# Patient Record
Sex: Female | Born: 1975 | Race: White | Hispanic: No | Marital: Married | Smoking: Never smoker
Health system: Southern US, Community
[De-identification: ages and names within clinical notes are randomized; demographics above are authoritative.]

## PROBLEM LIST (undated history)

## (undated) ENCOUNTER — Emergency Department (HOSPITAL_COMMUNITY): Admission: EM | Discharge status: Absent without leave | Payer: 59

## (undated) DIAGNOSIS — K219 Gastro-esophageal reflux disease without esophagitis: Secondary | ICD-10-CM

## (undated) DIAGNOSIS — N816 Rectocele: Secondary | ICD-10-CM

## (undated) DIAGNOSIS — N393 Stress incontinence (female) (male): Secondary | ICD-10-CM

## (undated) DIAGNOSIS — Z973 Presence of spectacles and contact lenses: Secondary | ICD-10-CM

## (undated) DIAGNOSIS — K589 Irritable bowel syndrome without diarrhea: Secondary | ICD-10-CM

## (undated) HISTORY — PX: KNEE ARTHROSCOPY: SUR90

---

## 1998-08-26 HISTORY — PX: FRACTURE SURGERY: SHX138

## 1999-08-06 HISTORY — PX: ORIF FEMUR FRACTURE: SHX2119

## 2002-07-10 ENCOUNTER — Encounter: Payer: Self-pay | Admitting: Emergency Medicine

## 2002-07-10 ENCOUNTER — Emergency Department (HOSPITAL_COMMUNITY): Admission: EM | Admit: 2002-07-10 | Discharge: 2002-07-10 | Payer: Self-pay | Admitting: Emergency Medicine

## 2003-06-19 ENCOUNTER — Ambulatory Visit (HOSPITAL_COMMUNITY): Admission: RE | Admit: 2003-06-19 | Discharge: 2003-06-19 | Payer: Self-pay | Admitting: Family Medicine

## 2003-07-17 ENCOUNTER — Ambulatory Visit (HOSPITAL_COMMUNITY): Admission: RE | Admit: 2003-07-17 | Discharge: 2003-07-17 | Payer: Self-pay | Admitting: *Deleted

## 2003-12-11 ENCOUNTER — Ambulatory Visit (HOSPITAL_COMMUNITY): Admission: RE | Admit: 2003-12-11 | Discharge: 2003-12-11 | Payer: Self-pay | Admitting: *Deleted

## 2004-04-13 ENCOUNTER — Ambulatory Visit (HOSPITAL_COMMUNITY): Admission: RE | Admit: 2004-04-13 | Discharge: 2004-04-13 | Payer: Self-pay | Admitting: Internal Medicine

## 2004-04-29 ENCOUNTER — Ambulatory Visit: Payer: Self-pay | Admitting: Orthopedic Surgery

## 2004-06-28 ENCOUNTER — Ambulatory Visit: Payer: Self-pay | Admitting: Orthopedic Surgery

## 2004-07-06 ENCOUNTER — Ambulatory Visit: Payer: Self-pay | Admitting: Orthopedic Surgery

## 2004-07-06 ENCOUNTER — Ambulatory Visit (HOSPITAL_COMMUNITY): Admission: RE | Admit: 2004-07-06 | Discharge: 2004-07-06 | Payer: Self-pay | Admitting: Orthopedic Surgery

## 2004-07-08 ENCOUNTER — Ambulatory Visit: Payer: Self-pay | Admitting: Orthopedic Surgery

## 2004-07-09 ENCOUNTER — Encounter (HOSPITAL_COMMUNITY): Admission: RE | Admit: 2004-07-09 | Discharge: 2004-08-08 | Payer: Self-pay | Admitting: Orthopedic Surgery

## 2004-07-28 ENCOUNTER — Ambulatory Visit: Payer: Self-pay | Admitting: Orthopedic Surgery

## 2004-08-11 ENCOUNTER — Ambulatory Visit: Payer: Self-pay | Admitting: Orthopedic Surgery

## 2004-10-21 ENCOUNTER — Ambulatory Visit: Payer: Self-pay | Admitting: Orthopedic Surgery

## 2005-04-29 ENCOUNTER — Ambulatory Visit: Payer: Self-pay | Admitting: Internal Medicine

## 2005-10-26 ENCOUNTER — Ambulatory Visit: Payer: Self-pay | Admitting: Internal Medicine

## 2007-10-01 ENCOUNTER — Encounter (INDEPENDENT_AMBULATORY_CARE_PROVIDER_SITE_OTHER): Payer: Self-pay | Admitting: Internal Medicine

## 2007-10-01 ENCOUNTER — Ambulatory Visit: Payer: Self-pay | Admitting: Internal Medicine

## 2007-10-01 ENCOUNTER — Encounter: Payer: Self-pay | Admitting: Internal Medicine

## 2007-10-01 DIAGNOSIS — E669 Obesity, unspecified: Secondary | ICD-10-CM | POA: Insufficient documentation

## 2007-10-01 DIAGNOSIS — R5381 Other malaise: Secondary | ICD-10-CM | POA: Insufficient documentation

## 2007-10-01 DIAGNOSIS — R5383 Other fatigue: Secondary | ICD-10-CM

## 2007-10-02 ENCOUNTER — Telehealth (INDEPENDENT_AMBULATORY_CARE_PROVIDER_SITE_OTHER): Payer: Self-pay | Admitting: Internal Medicine

## 2007-10-02 ENCOUNTER — Encounter (INDEPENDENT_AMBULATORY_CARE_PROVIDER_SITE_OTHER): Payer: Self-pay | Admitting: Internal Medicine

## 2007-10-02 LAB — CONVERTED CEMR LAB
AST: 10 units/L (ref 0–37)
Albumin: 4.2 g/dL (ref 3.5–5.2)
Alkaline Phosphatase: 55 units/L (ref 39–117)
BUN: 10 mg/dL (ref 6–23)
Basophils Relative: 0 % (ref 0–1)
Creatinine, Ser: 0.7 mg/dL (ref 0.40–1.20)
Eosinophils Absolute: 0.1 10*3/uL (ref 0.0–0.7)
Eosinophils Relative: 2 % (ref 0–5)
Free T4: 1.21 ng/dL (ref 0.89–1.80)
HCT: 42.3 % (ref 36.0–46.0)
MCHC: 32.4 g/dL (ref 30.0–36.0)
MCV: 89.6 fL (ref 78.0–100.0)
Monocytes Relative: 8 % (ref 3–12)
Neutrophils Relative %: 59 % (ref 43–77)
Potassium: 4.1 meq/L (ref 3.5–5.3)
RBC: 4.72 M/uL (ref 3.87–5.11)
Total Bilirubin: 0.3 mg/dL (ref 0.3–1.2)

## 2007-10-04 ENCOUNTER — Encounter (INDEPENDENT_AMBULATORY_CARE_PROVIDER_SITE_OTHER): Payer: Self-pay | Admitting: Internal Medicine

## 2007-11-23 ENCOUNTER — Ambulatory Visit: Payer: Self-pay | Admitting: Internal Medicine

## 2007-11-23 DIAGNOSIS — J029 Acute pharyngitis, unspecified: Secondary | ICD-10-CM | POA: Insufficient documentation

## 2007-11-23 LAB — CONVERTED CEMR LAB
Inflenza A Ag: NEGATIVE
Influenza B Ag: NEGATIVE
Rapid Strep: NEGATIVE

## 2007-11-24 ENCOUNTER — Encounter (INDEPENDENT_AMBULATORY_CARE_PROVIDER_SITE_OTHER): Payer: Self-pay | Admitting: Internal Medicine

## 2010-04-06 NOTE — Assessment & Plan Note (Signed)
Summary: acute pharyngitis   Vital Signs:  Patient Profile:   35 Years Old Female Height:     67.75 inches (172.72 cm) O2 Sat:      98 % O2 treatment:    Room Air Temp:     100.3 degrees F tympanic Pulse rate:   108 / minute Resp:     9 per minute BP sitting:   106 / 64  Vitals Entered By: Lutricia Horsfall, LPN (November 23, 2007 2:50 PM)                 Chief Complaint:  sore throat.  History of Present Illness: Last night noted the onset of sore throat and took nyquil but it didn't help.  She has some ear pain and a little bit of a cough.  She denies nasal congestion, post nasal drip, body aches, nausea, vomiting or diarrhea.  Has 68 month old daughter but she is not sick.      Current Allergies: No known allergies       Physical Exam  General:     Obese, in no acute distress. Alert and oriented X 3.  Ears:     Tympanic membranes clear.  Ear canals without erythema.  Nose:     mild Erythema and swelling with clear drainage.  Mouth:     + erythema and mild tonsilar enlargement without exudate. Neck:     no cervical lymphadenopathy.   Lungs:     Clear to auscultation bilaterally with good air movement, normal expansion.     Impression & Recommendations:  Problem # 1:  PHARYNGITIS, ACUTE (ICD-462) Suspect viral with negative strep swab but she meets 2/4 criteria for strep so will get strep culture.  Symptomatic treatment with tylenol and Duke's Magic Mouthwash. Orders: Flu A+B ([address]109) Rapid Strep (60454)   Complete Medication List: 1)  Ortho-novum 7/7/7 (28) 0.5/0.75/1-35 Mg-mcg Tabs (Norethin-eth estrad triphasic) .Marland Kitchen.. 1 by mouth once daily 2)  Zyrtec Allergy 10 Mg Tabs (Cetirizine hcl) .Marland Kitchen.. 1 by mouth once daily 3)  Duke's Magic Mouthwash  .... 5 ml gargle and spit every 4 to 6 hours as needed sore throat   Patient Instructions: 1)  The patient will be called with the results when they are available.   Prescriptions: DUKE'S MAGIC MOUTHWASH  5 ml gargle and spit every 4 to 6 hours as needed sore throat  #174ml x 0   Entered and Authorized by:   Erle Crocker MD   Signed by:   Erle Crocker MD on 11/23/2007   Method used:   Printed then faxed to ...       [facility]* (retail)       [address]       [place], [place]  [address]       Ph: [phone]       Fax: [phone]   RxID:   [id number]  ] Laboratory Results  Date/Time Received: November 23, 2007 2:55 PM   Other Tests  Rapid Strep: negative Influenza A: negative Influenza B: negative Comments: Strep Lot #[id number] exp 09-09-08 Flu Lot # [id number] exp 12-23-07  Kit Test Internal QC: Positive   (Normal Range: Negative)

## 2010-07-23 NOTE — H&P (Signed)
NAMEBELEN, Kendra Payne             ACCOUNT NO.:  [id number]   MEDICAL RECORD NO.:  [id number]          PATIENT TYPE:  AMB   LOCATION:  DAY                           FACILITY:  [facility]   PHYSICIAN:  Vickki Hearing, M.D.DATE OF BIRTH:  06-27-1975   DATE OF ADMISSION:  07/06/2004  DATE OF DISCHARGE:  LH                                HISTORY & PHYSICAL   CHIEF COMPLAINT:  Pain and locking left knee.   HISTORY OF PRESENT ILLNESS:  Ms. Kendra Payne is 35 years old, and presented  initially on April 29, 2004 with left knee pain and a history of locking  after twisting her knee.  At the time I saw her, most of her pain and  locking episode had resolved, and she was treated for patellofemoral pain  syndrome, and was put on a home exercise program.  She did well for 2  months, and then had a pseudolocking episode of the knee again in late April  of 2006.  Re-examination revealed medial joint line tenderness and a range  of motion of 10-80, suggesting mechanical problem in the knee, and she has  presented for arthroscopy of the left knee with presumed medial meniscal  tear.   REVIEW OF SYSTEMS:  Negative, except for musculoskeletal symptoms.   ALLERGIES:  She has no allergies.   PAST MEDICAL HISTORY:  No previous medical problems.  She has a left femoral  rod.   MEDICATIONS:  She takes an estrogen combination pill/BCP.   FAMILY HISTORY:  Heart disease, cancer, diabetes.   FAMILY PHYSICIAN:  Dr. Sherwood Gambler.   SOCIAL HISTORY:  She is married.  She is a Pension scheme manager.  She does not smoke or drink.  She does use caffeinated  beverages.  She has her high school diploma.   PHYSICAL EXAMINATION:  VITAL SIGNS:  Weight 210, pulse 70, respiratory rate  18.  Temperature normal.  GENERAL:  Normal development, grooming, hygiene, nutrition.  Body habitus  mesomorphic.  She is alert and oriented x3.  Sensation and neurologic  function normal.  CARDIOVASCULAR:  Normal pulse and  perfusion.  No venostasis.  EXTREMITIES:  No edema.  Left knee exam shows range of motion again at 10 to  80.  Muscle tone is good.  Knee is stable.  Tenderness over the medial  compartment.  Other extremities have normal range of motion, strength,  stability, and alignment.  SKIN:  Normal without rash, lesions, or ulcers.   RADIOGRAPHS:  No specific abnormalities on plain film.   IMPRESSION:  1.  Medial meniscal tear.  2.  Locked knee.   PLAN:  Arthroscopy, left knee.  Medial meniscectomy.  Removal of any  mechanical problems which are associated with her locking episodes.      SEH/MEDQ  D:  07/03/2004  T:  07/03/2004  Job:  [id number]

## 2010-07-23 NOTE — Procedures (Signed)
NAMEERRIN, WHITELAW             ACCOUNT NO.:  [id number]   MEDICAL RECORD NO.:  [id number]          PATIENT TYPE:  OUT   LOCATION:  RAD                           FACILITY:  [facility]   PHYSICIAN:  Dani Gobble, MD       DATE OF BIRTH:  07-01-1975   DATE OF PROCEDURE:  12/11/2003  DATE OF DISCHARGE:                                  ECHOCARDIOGRAM   INDICATION:  Ms. Lemire is a 35 year old female who is referred for  evaluation of left ventricular function.  The technical quality of the study  is adequate.   The aorta is within normal limits at 3.3 cm.   The left atrium is within normal limits at 3.4 cm.  No obvious clots or  masses were appreciated, and the patient appeared to be in sinus rhythm  during this procedure.   The intraventricular septum and posterior wall are within normal limits in  thickness at 1.0 cm for each.   The aortic valve appears structurally normal.  There is no aortic  insufficiency.  Doppler interrogation of the aortic valve is within normal  limits.   The mitral valve also appears structurally normal.  There is no mitral valve  prolapse.  There is trivial mitral regurgitation.  Doppler interrogation of  the mitral valve is within normal limits.   The pulmonic valve appears grossly structurally normal.   The tricuspid valve also appears grossly structurally normal with mild  tricuspid regurgitation noted.   The left ventricle is normal in size with the LVIDD measured at 5.6 cm, and  the LVISD measured at 3.6 cm.  Overall left ventricular systolic function is  normal, and no regional wall motion abnormalities are appreciated.  The  right atrium and right ventricle appear normal in size, and the right  ventricle appears to exhibit normal right ventricular systolic function.   IMPRESSION:  Essentially normal echocardiogram, except for mild tricuspid  regurgitation.     AB/MEDQ  D:  12/11/2003  T:  12/11/2003  Job:  [id number]   cc:   Phillips Odor(?)  Charlette Caffey(?), M.D.

## 2010-07-23 NOTE — Op Note (Signed)
Kendra Payne, Kendra Payne             ACCOUNT NO.:  [id number]   MEDICAL RECORD NO.:  [id number]          PATIENT TYPE:  AMB   LOCATION:  DAY                           FACILITY:  [facility]   PHYSICIAN:  Vickki Hearing, M.D.DATE OF BIRTH:  [birth year]   DATE OF PROCEDURE:  07/06/2004  DATE OF DISCHARGE:                                 OPERATIVE REPORT   PREOPERATIVE DIAGNOSIS:  Torn medial meniscus, left knee.   POSTOPERATIVE DIAGNOSES:  1.  Chondral flap tear, medial femoral condyle.  2.  Chondromalacia of the patella, grade 3.   PROCEDURES:  1.  Arthroscopy left knee.  2.  Microfracture, medial femoral condyle.  3.  Chondroplasty, median ridge of patella.   SURGEON:  Vickki Hearing, M.D., no assistants.   ANESTHETIC:  General by LMA.   OPERATIVE FINDINGS:  There was a large chondral flap tear of the medial  femoral condyle measuring 20 mm longitudinally x 15 mm medial to lateral.  There was significant and severe chondromalacia of the median ridge of the  patella, grade 3.   HISTORY:  This is a 35 year old female who presented with complaints of  locking of the left knee.  On initial presentation her symptoms had  resolved, and we decided to treat her expectantly.  She came in with another  locking episode and we scheduled her for surgery for presumed medial  meniscal tear.   The patient was identified as Kendra Payne in the preop holding area, and  a mark was placed over the left knee.  This was countersigned by the  surgeon, the history and physical was updated, she was started on  antibiotics and taken to the operating room for general anesthetic via LMA  technique.  After successful anesthesia, the left leg was placed in a well  leg holder. The right leg was padded.  After sterile prep and drape, a time-  out was taken and the procedure was confirmed as arthroscopy, left knee, on  [employer], antibiotics were in, equipment was present to do the   procedure.   A two-incision technique was performed.  A diagnostic arthroscopy was done.  A probe through the medial portal was used to palpate the intra-articular  structures. The findings are as stated.   Using a straight shaver and an ArthroCare wand, a chondroplasty was done on  the medial femoral condyle and the patella.  We then took a chondral pick  and performed microfracture of the medial femoral condyle.  The knee was  then irrigated and suctioned, injected with 30 mL of plain Sensorcaine 0.5%,  and then we covered the portals with Steri-Strips.  We placed a sterile  bandage and a Cryo/Cuff and extubated the patient, took her to the recovery  room in stable condition.  She will be nonweightbearing for the first four  weeks, immediate range of motion can start.      SEH/MEDQ  D:  07/06/2004  T:  07/06/2004  Job:  [id number]

## 2010-07-23 NOTE — Procedures (Signed)
NAME:  ABEL, RA                       ACCOUNT NO.:  [id number]   MEDICAL RECORD NO.:  [id number]                   PATIENT TYPE:  OUT   LOCATION:  RAD                                  FACILITY:  [facility]   PHYSICIAN:  Vida Roller, M.D.                DATE OF BIRTH:  03/19/1975   DATE OF PROCEDURE:  06/19/2003  DATE OF DISCHARGE:                                  ECHOCARDIOGRAM   TAPE NUMBER:  LB [id number], tape count [id number] through [id number].   INDICATION:  This is a 35 year old woman with dyspnea.  No previous  echocardiograms.   TECHNICAL QUALITY:  Adequate.   M-MODE TRACINGS:  1. The aorta is 34 mm.  2. The left atrium is 32 mm.  3. The septum is 7 mm.  4. Left ventricular posterior wall is 7 mm.  5. Left ventricular diastolic dimension is 54 mm.  6. Left ventricular systolic dimension is 41 mm.   2-D AND DOPPLER IMAGING:  1. The left ventricle is slightly dilated.  There is depressed LV function     with an estimated ejection fraction of 45 to 50%.  There is global     hypokinesis.  Diastolic function was not well assessed.  2. The right ventricle is normal in size with normal systolic function.  3. Both atria appear to be normal size.  There is no atrial septal defect.  4. The aortic, mitral and tricuspid valves all perform normally with no     evidence of stenosis or regurgitation.  5. The pulmonic valve was not well seen.  6. The pericardial structures appeared normal.  7. The inferior vena cava was not well seen.  8. Ascending aorta was not well seen.      ___________________________________________                                            Vida Roller, M.D.   JH/MEDQ  D:  06/19/2003  T:  06/19/2003  Job:  [id number]

## 2015-04-01 DIAGNOSIS — M79632 Pain in left forearm: Secondary | ICD-10-CM | POA: Diagnosis not present

## 2015-04-03 DIAGNOSIS — M79632 Pain in left forearm: Secondary | ICD-10-CM | POA: Diagnosis not present

## 2015-06-17 DIAGNOSIS — Z0000 Encounter for general adult medical examination without abnormal findings: Secondary | ICD-10-CM | POA: Diagnosis not present

## 2015-06-17 DIAGNOSIS — Z6836 Body mass index (BMI) 36.0-36.9, adult: Secondary | ICD-10-CM | POA: Diagnosis not present

## 2015-06-17 DIAGNOSIS — K219 Gastro-esophageal reflux disease without esophagitis: Secondary | ICD-10-CM | POA: Diagnosis not present

## 2015-06-17 DIAGNOSIS — M545 Low back pain: Secondary | ICD-10-CM | POA: Diagnosis not present

## 2015-07-14 DIAGNOSIS — Z1231 Encounter for screening mammogram for malignant neoplasm of breast: Secondary | ICD-10-CM | POA: Diagnosis not present

## 2015-07-25 DIAGNOSIS — Z79899 Other long term (current) drug therapy: Secondary | ICD-10-CM | POA: Diagnosis not present

## 2015-07-25 DIAGNOSIS — R635 Abnormal weight gain: Secondary | ICD-10-CM | POA: Diagnosis not present

## 2015-07-25 DIAGNOSIS — R5383 Other fatigue: Secondary | ICD-10-CM | POA: Diagnosis not present

## 2015-07-25 DIAGNOSIS — R0602 Shortness of breath: Secondary | ICD-10-CM | POA: Diagnosis not present

## 2015-07-27 MED FILL — PHENTERMINE 37.5 MG TABLET: 37.5 | 30 days supply | Qty: 30 | Fill #0

## 2015-08-08 DIAGNOSIS — Z79899 Other long term (current) drug therapy: Secondary | ICD-10-CM | POA: Diagnosis not present

## 2015-08-08 DIAGNOSIS — E559 Vitamin D deficiency, unspecified: Secondary | ICD-10-CM | POA: Diagnosis not present

## 2015-08-08 DIAGNOSIS — R635 Abnormal weight gain: Secondary | ICD-10-CM | POA: Diagnosis not present

## 2015-08-08 DIAGNOSIS — R5383 Other fatigue: Secondary | ICD-10-CM | POA: Diagnosis not present

## 2015-08-28 DIAGNOSIS — Z79899 Other long term (current) drug therapy: Secondary | ICD-10-CM | POA: Diagnosis not present

## 2015-08-28 DIAGNOSIS — R635 Abnormal weight gain: Secondary | ICD-10-CM | POA: Diagnosis not present

## 2015-09-25 DIAGNOSIS — Z79899 Other long term (current) drug therapy: Secondary | ICD-10-CM | POA: Diagnosis not present

## 2015-09-25 DIAGNOSIS — R635 Abnormal weight gain: Secondary | ICD-10-CM | POA: Diagnosis not present

## 2015-09-28 MED FILL — PHENTERMINE 37.5 MG TABLET: 37.5 | 30 days supply | Qty: 30 | Fill #0

## 2015-10-23 DIAGNOSIS — Z79899 Other long term (current) drug therapy: Secondary | ICD-10-CM | POA: Diagnosis not present

## 2015-10-23 DIAGNOSIS — R635 Abnormal weight gain: Secondary | ICD-10-CM | POA: Diagnosis not present

## 2015-10-27 DIAGNOSIS — N393 Stress incontinence (female) (male): Secondary | ICD-10-CM | POA: Diagnosis not present

## 2015-10-27 DIAGNOSIS — N814 Uterovaginal prolapse, unspecified: Secondary | ICD-10-CM | POA: Diagnosis not present

## 2015-10-29 MED FILL — PHENTERMINE 37.5 MG TABLET: 37.5 | 30 days supply | Qty: 30 | Fill #0

## 2015-11-05 ENCOUNTER — Telehealth (HOSPITAL_COMMUNITY): Payer: Self-pay

## 2015-11-19 ENCOUNTER — Encounter (HOSPITAL_COMMUNITY): Payer: Self-pay

## 2015-11-19 ENCOUNTER — Ambulatory Visit (HOSPITAL_COMMUNITY): Payer: Self-pay | Admitting: Physical Therapy

## 2015-11-23 ENCOUNTER — Ambulatory Visit (HOSPITAL_COMMUNITY): Payer: 59 | Attending: Obstetrics and Gynecology | Admitting: Physical Therapy

## 2015-11-23 DIAGNOSIS — N393 Stress incontinence (female) (male): Secondary | ICD-10-CM | POA: Insufficient documentation

## 2015-11-23 DIAGNOSIS — M6281 Muscle weakness (generalized): Secondary | ICD-10-CM | POA: Diagnosis not present

## 2015-11-23 NOTE — Therapy (Signed)
[place] [facility] [address] [place], [place], [address] Phone: [phone]   Fax:  [phone]  Physical Therapy Evaluation  Patient Details  Name: Kendra LacyDebbie G Niday MRN: [id number] Date of Birth: 1975-05-05 Referring Provider: Silas Floodionne Galloway  Encounter Date: 11/23/2015      PT End of Session - 11/23/15 1227    Visit Number 1   Number of Visits 4   Date for PT Re-Evaluation 12/23/15   Authorization - Visit Number 1   Authorization - Number of Visits 4   PT Start Time 0910   PT Stop Time 0945   PT Time Calculation (min) 35 min   Activity Tolerance Patient tolerated treatment well   Behavior During Therapy [facility] for tasks assessed/performed      No past medical history on file.  No past surgical history on file.  There were no vitals filed for this visit.       Subjective Assessment - 11/23/15 0916    Subjective Kendra Payne states that she went to the MD because when she was washing she felt something hard in her vaginal area.  She felt it was her uterus which her MD confirmed.  She states that she has difficulty holding her urine when she sneezes , run or coughs.     Pertinent History 3 pregnancys    Currently in Pain? No/denies  but has occasional cramping pain 2x a week 7/10             [facility] PT Assessment - 11/23/15 0001      Assessment   Medical Diagnosis stress incontinence    Referring Provider Silas Floodionne Galloway   Onset Date/Surgical Date 10/06/13   Next MD Visit not scheduled   Prior Therapy none     Precautions   Precautions None     Restrictions   Weight Bearing Restrictions No     Balance Screen   Has the patient fallen in the past 6 months No     ROM / Strength   AROM / PROM / Strength --  upper abdominal 3/5; Lower 3/5;  kendall testing                  Pelvic Floor Special Questions - 11/23/15 0001    Prior Pelvic/Prostate Exam No   Are you Pregnant or attempting pregnancy? No    Prior Pregnancies Yes   Number of Pregnancies 3   Number of Vaginal Deliveries 3   Any difficulty with labor and deliveries No   Episiotomy Performed Yes   Currently Sexually Active Yes   Urinary Leakage Yes   How often depends if she sneezes or coughs but is getting worse.    Pad use no    Activities that cause leaking Coughing;Sneezing;Laughing;Running   Falling out feeling (prolapse) Yes   Activities that cause feeling of prolapse --  no activities just can feel the tip of her uterus.    Incontinence Goals Patient will be independent with completion of home exercise program for pelvic floor exercise, progressing to standing for all ADL's/activities in 4 weeks.;Patient will be independent with self-management techniques including: pelvic floor exercise, urge control techniques, behavioral modifications (reducing caffeine intake) and bladder retraining in 12 weeks;Patient will not have leaking with stress induced activities such as coughing, sneezing, laughing, etc in 12 weeks.          [facility] Treatment/Exercise - 11/23/15 0001      Exercises   Exercises Lumbar  Lumbar Exercises: Supine   Ab Set 5 reps   Other Supine Lumbar Exercises Kegal slow twitch hold 10 seconds relax 20 x 5                PT Education - 11/23/15 0951    Education provided Yes   Education Details Pt given Kegal sheet explained how to perform; and lower abdominal strengthening (isometric)    Person(s) Educated Patient   Methods Explanation;Demonstration;Handout   Comprehension Verbalized understanding;Returned demonstration          PT Short Term Goals - 11/23/15 1230      PT SHORT TERM GOAL #1   Title Pt to be completing pelvic floor and lower abdominal exercises as prescibe to reach long term goal of not leaking urine.    Time 1   Period Weeks   Status New     PT SHORT TERM GOAL #2   Title Pelvic pain to occur only once a week and level to be no greater than a 5/10 to improve  overall comfort.    Time 2   Period Weeks   Status New           PT Long Term Goals - 11/23/15 1233      PT LONG TERM GOAL #1   Title Pt to state that she has not had an episode of incontinence in the past week    Time 4   Period Weeks   Status New     PT LONG TERM GOAL #2   Title Pt to state that she is only having pelvic pain approximately once every other week and the greatest is a 3/10 to promote overall comfort.    Time 4   Period Weeks   Status New               Plan - 11/23/15 1227    Clinical Impression Statement Ms. Brenner is a 40 yo female who has had three children.  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  She is being referred to skilled physical therapy to assist in strengthening both her pelvic floor and her lower abdominal musculature.     Rehab Potential Fair   PT Frequency 1x / week   PT Duration 4 weeks   PT Treatment/Interventions ADLs/Self Care Home Management;Patient/family education;Therapeutic activities;Therapeutic exercise   PT Next Visit Plan begin heelslides, crunches, SLR progress lower abdominal strengthening as able.    PT Home Exercise Plan kegal, abdominal isometric    Consulted and Agree with Plan of Care Patient      Patient will benefit from skilled therapeutic intervention in order to improve the following deficits and impairments:  Decreased activity tolerance, Other (comment), Pain  Visit Diagnosis: Stress incontinence (female) (female) - Plan: PT plan of care cert/re-cert  Muscle weakness (generalized) - Plan: PT plan of care cert/re-cert     Problem List Patient Active Problem List   Diagnosis Date Noted  . PHARYNGITIS, ACUTE 11/23/2007  . OBESITY 10/01/2007  . FATIGUE 10/01/2007    Virgina Organ, PT CLT [phone] 11/23/2015, 12:42 PM  [facility] [address]  [place], [place], [address] Phone: [phone]   Fax:  [phone]  Name: Kendra Payne MRN: [id number] Date of Birth: Jan 25, 1976

## 2015-12-03 ENCOUNTER — Ambulatory Visit (HOSPITAL_COMMUNITY): Payer: 59 | Admitting: Physical Therapy

## 2015-12-07 ENCOUNTER — Telehealth (HOSPITAL_COMMUNITY): Payer: Self-pay

## 2015-12-07 ENCOUNTER — Ambulatory Visit (HOSPITAL_COMMUNITY): Payer: 59 | Admitting: Physical Therapy

## 2015-12-07 NOTE — Telephone Encounter (Signed)
12/07/15 patient came in and said to just cancel all remaining appointments.  She said that she was going to talk to her dr before resuming therapy

## 2015-12-14 ENCOUNTER — Encounter (HOSPITAL_COMMUNITY): Payer: 59 | Admitting: Physical Therapy

## 2015-12-21 ENCOUNTER — Encounter (HOSPITAL_COMMUNITY): Payer: 59 | Admitting: Physical Therapy

## 2016-01-25 DIAGNOSIS — Z6834 Body mass index (BMI) 34.0-34.9, adult: Secondary | ICD-10-CM | POA: Diagnosis not present

## 2016-01-25 DIAGNOSIS — Z1151 Encounter for screening for human papillomavirus (HPV): Secondary | ICD-10-CM | POA: Diagnosis not present

## 2016-01-25 DIAGNOSIS — Z01419 Encounter for gynecological examination (general) (routine) without abnormal findings: Secondary | ICD-10-CM | POA: Diagnosis not present

## 2016-01-25 DIAGNOSIS — Z3041 Encounter for surveillance of contraceptive pills: Secondary | ICD-10-CM | POA: Diagnosis not present

## 2016-01-25 DIAGNOSIS — Z803 Family history of malignant neoplasm of breast: Secondary | ICD-10-CM | POA: Diagnosis not present

## 2016-01-25 DIAGNOSIS — Z315 Encounter for genetic counseling: Secondary | ICD-10-CM | POA: Diagnosis not present

## 2016-02-24 DIAGNOSIS — Z315 Encounter for genetic counseling: Secondary | ICD-10-CM | POA: Diagnosis not present

## 2016-02-24 DIAGNOSIS — Z6834 Body mass index (BMI) 34.0-34.9, adult: Secondary | ICD-10-CM | POA: Diagnosis not present

## 2016-02-24 DIAGNOSIS — Z1371 Encounter for nonprocreative screening for genetic disease carrier status: Secondary | ICD-10-CM | POA: Diagnosis not present

## 2016-09-16 DIAGNOSIS — M79662 Pain in left lower leg: Secondary | ICD-10-CM | POA: Diagnosis not present

## 2016-09-16 DIAGNOSIS — M7989 Other specified soft tissue disorders: Secondary | ICD-10-CM | POA: Diagnosis not present

## 2016-09-20 DIAGNOSIS — R600 Localized edema: Secondary | ICD-10-CM | POA: Diagnosis not present

## 2016-09-20 DIAGNOSIS — M7989 Other specified soft tissue disorders: Secondary | ICD-10-CM | POA: Diagnosis not present

## 2016-09-20 DIAGNOSIS — M79662 Pain in left lower leg: Secondary | ICD-10-CM | POA: Diagnosis not present

## 2016-10-22 DIAGNOSIS — R21 Rash and other nonspecific skin eruption: Secondary | ICD-10-CM | POA: Diagnosis not present

## 2016-11-17 DIAGNOSIS — Z1231 Encounter for screening mammogram for malignant neoplasm of breast: Secondary | ICD-10-CM | POA: Diagnosis not present

## 2017-01-12 ENCOUNTER — Ambulatory Visit (INDEPENDENT_AMBULATORY_CARE_PROVIDER_SITE_OTHER): Payer: 59 | Admitting: Pediatrics

## 2017-01-12 ENCOUNTER — Encounter: Payer: Self-pay | Admitting: Pediatrics

## 2017-01-12 VITALS — BP 116/81 | HR 65 | Temp 97.5°F | Ht 67.75 in | Wt 236.0 lb

## 2017-01-12 DIAGNOSIS — Z0000 Encounter for general adult medical examination without abnormal findings: Secondary | ICD-10-CM | POA: Diagnosis not present

## 2017-01-12 DIAGNOSIS — Z6836 Body mass index (BMI) 36.0-36.9, adult: Secondary | ICD-10-CM

## 2017-01-12 DIAGNOSIS — I8392 Asymptomatic varicose veins of left lower extremity: Secondary | ICD-10-CM

## 2017-01-12 DIAGNOSIS — Z803 Family history of malignant neoplasm of breast: Secondary | ICD-10-CM | POA: Insufficient documentation

## 2017-01-12 DIAGNOSIS — R0683 Snoring: Secondary | ICD-10-CM

## 2017-01-12 DIAGNOSIS — E669 Obesity, unspecified: Secondary | ICD-10-CM | POA: Diagnosis not present

## 2017-01-12 NOTE — Progress Notes (Signed)
Subjective:   Patient ID: Kendra Payne, female    DOB: [birth year], 41 y.o.   MRN: [id number] CC: New Patient (Initial Visit) annual visit HPI: Kendra Payne is a 41 y.o. female presenting for New Patient (Initial Visit)  Married, three kids, 9yo, 6yo, 5yo Not walking regularly now, was more regularly in the past, apprx 3-4 times a month  Pap smear: due this month Gets mammograms regularly Follows with [facility] in [place] with breast cancer gene testing last year due to multiple family members with breast cancer, MGM, maternal aunts, testing was negative 36% lifetime risk she says  Feeling tired all the time Wakes up with a headache several days a week Wakes herself up choking/gasping for air a few times a month Has been sleeping propped up on pillows with some improvement in symptoms Husband says she snores loudly  L leg aching off and on Has been told she has varicose veins, venous insufficiency by prior PCP Recently had [place] of leg, no clot Has a rod in L femur Aching bothers her at night Pain and swelling improved when she wears compression hose  PMH: obesity, PCOS  Family History  Problem Relation Age of Onset  . Cancer Maternal Grandmother   . Diabetes Maternal Grandmother   . Cancer Maternal Grandfather   . Diabetes Maternal Grandfather   MGGM with colon cancer  Social History   Socioeconomic History  . Marital status: Married    Spouse name: [name]   . Number of children: 3  . Years of education: None  . Highest education level: Associate degree: academic program  Social Needs  . Financial resource strain: Not hard at all  . Food insecurity - worry: Never true  . Food insecurity - inability: Never true  . Transportation needs - medical: No  . Transportation needs - non-medical: No  Occupational History  . Occupation: Investment banker, operational: [place]  Tobacco Use  . Smoking status: Never  Smoker  . Smokeless tobacco: Never Used  Substance and Sexual Activity  . Alcohol use: No    Frequency: Never  . Drug use: No  . Sexual activity: Yes    Birth control/protection: Pill  Other Topics Concern  . None  Social History Narrative  . None   ROS: All systems negative other than what is in HPI  Objective:    BP 116/81   Pulse 65   Temp (!) 97.5 F (36.4 C) (Oral)   Ht 5' 7.75" (1.721 m)   Wt 236 lb (107 kg)   BMI 36.15 kg/m   Wt Readings from Last 3 Encounters:  01/12/17 236 lb (107 kg)  10/26/05 (!) 235 lb (106.6 kg)    Gen: NAD, alert, cooperative with exam, NCAT EYES: EOMI, no conjunctival injection, or no icterus ENT:  TMs slightly dull b/l with clear effusion, OP with mild erythema LYMPH: no cervical LAD CV: NRRR, normal S1/S2, no murmur, distal pulses 2+ b/l Resp: CTABL, no wheezes, normal WOB Abd: +BS, soft, NTND. no guarding or organomegaly Ext: No pitting edema, warm Neuro: Alert and oriented, strength equal b/l UE and LE, coordination grossly normal MSK: normal muscle bulk Skin: L medial knee and lower leg with superficial vessels, small varicose veins  Assessment & Plan:  Ercie was seen today for new patient (initial visit), annual  Diagnoses and all orders for this visit:  Encounter for preventive health examination  Snoring -     Ambulatory referral to Neurology  Varicose veins of left lower extremity, unspecified whether complicated Wear compression hose  Class 2 obesity without serious comorbidity with body mass index (BMI) of 36.0 to 36.9 in adult, unspecified obesity type Discussed lifestyle changes, stop sodas, increase physical activity -     CBC with Differential/Platelet -     CMP14+EGFR -     Lipid panel -     TSH  Family history of breast cancer Cont regular mammograms  Follow up plan: Return in about 1 year (around 01/12/2018). Assunta Found, MD [place]

## 2017-01-13 LAB — LIPID PANEL
CHOL/HDL RATIO: 3.8 ratio (ref 0.0–4.4)
Cholesterol, Total: 174 mg/dL (ref 100–199)
HDL: 46 mg/dL (ref >39)
LDL CALC: 103 mg/dL — AB (ref 0–99)
Triglycerides: 125 mg/dL (ref 0–149)
VLDL Cholesterol Cal: 25 mg/dL (ref 5–40)

## 2017-01-13 LAB — CBC WITH DIFFERENTIAL/PLATELET
BASOS: 0 %
Basophils Absolute: 0 10*3/uL (ref 0.0–0.2)
EOS (ABSOLUTE): 0.1 10*3/uL (ref 0.0–0.4)
Eos: 1 %
HEMATOCRIT: 38.7 % (ref 34.0–46.6)
HEMOGLOBIN: 12.7 g/dL (ref 11.1–15.9)
Immature Grans (Abs): 0 10*3/uL (ref 0.0–0.1)
Immature Granulocytes: 0 %
LYMPHS ABS: 3.6 10*3/uL — AB (ref 0.7–3.1)
Lymphs: 36 %
MCH: 29.8 pg (ref 26.6–33.0)
MCHC: 32.8 g/dL (ref 31.5–35.7)
MCV: 91 fL (ref 79–97)
MONOCYTES: 5 %
Monocytes Absolute: 0.5 10*3/uL (ref 0.1–0.9)
Neutrophils Absolute: 5.6 10*3/uL (ref 1.4–7.0)
Neutrophils: 58 %
Platelets: 385 10*3/uL — ABNORMAL HIGH (ref 150–379)
RBC: 4.26 x10E6/uL (ref 3.77–5.28)
RDW: 13.5 % (ref 12.3–15.4)
WBC: 9.9 10*3/uL (ref 3.4–10.8)

## 2017-01-13 LAB — CMP14+EGFR
ALT: 11 IU/L (ref 0–32)
AST: 15 IU/L (ref 0–40)
Albumin/Globulin Ratio: 1.4 (ref 1.2–2.2)
Albumin: 4.2 g/dL (ref 3.5–5.5)
Alkaline Phosphatase: 69 IU/L (ref 39–117)
BUN / CREAT RATIO: 12 (ref 9–23)
BUN: 10 mg/dL (ref 6–20)
Bilirubin Total: <0.2 mg/dL (ref 0.0–1.2)
CALCIUM: 9.5 mg/dL (ref 8.7–10.2)
CO2: 24 mmol/L (ref 20–29)
CREATININE: 0.82 mg/dL (ref 0.57–1.00)
Chloride: 99 mmol/L (ref 96–106)
GFR calc Af Amer: 104 mL/min/{1.73_m2} (ref >59)
GFR, EST NON AFRICAN AMERICAN: 90 mL/min/{1.73_m2} (ref >59)
GLOBULIN, TOTAL: 3 g/dL (ref 1.5–4.5)
Glucose: 83 mg/dL (ref 65–99)
Potassium: 4.7 mmol/L (ref 3.5–5.2)
SODIUM: 137 mmol/L (ref 134–144)
Total Protein: 7.2 g/dL (ref 6.0–8.5)

## 2017-01-13 LAB — TSH: TSH: 1.45 u[IU]/mL (ref 0.450–4.500)

## 2017-01-25 DIAGNOSIS — Z3041 Encounter for surveillance of contraceptive pills: Secondary | ICD-10-CM | POA: Diagnosis not present

## 2017-01-25 DIAGNOSIS — Z6836 Body mass index (BMI) 36.0-36.9, adult: Secondary | ICD-10-CM | POA: Diagnosis not present

## 2017-01-25 DIAGNOSIS — Z01419 Encounter for gynecological examination (general) (routine) without abnormal findings: Secondary | ICD-10-CM | POA: Diagnosis not present

## 2017-03-13 ENCOUNTER — Institutional Professional Consult (permissible substitution): Payer: 59 | Admitting: Neurology

## 2017-06-16 ENCOUNTER — Encounter: Payer: Self-pay | Admitting: Family

## 2017-06-16 ENCOUNTER — Ambulatory Visit (INDEPENDENT_AMBULATORY_CARE_PROVIDER_SITE_OTHER): Payer: No Typology Code available for payment source | Admitting: Family

## 2017-06-16 VITALS — BP 115/76 | HR 67 | Temp 99.1°F | Resp 20 | Ht 67.75 in | Wt 237.0 lb

## 2017-06-16 DIAGNOSIS — J069 Acute upper respiratory infection, unspecified: Secondary | ICD-10-CM

## 2017-06-16 MED ORDER — HYDROCODONE-HOMATROPINE 5-1.5 MG/5ML PO SYRP: 5.0000 mL | ORAL_SOLUTION | Freq: Three times a day (TID) | ORAL | 0 refills | Status: DC | PRN

## 2017-06-16 MED ORDER — BENZONATATE 200 MG PO CAPS: 200.0000 mg | ORAL_CAPSULE | Freq: Three times a day (TID) | ORAL | 1 refills | Status: DC | PRN

## 2017-06-16 MED ORDER — FLUTICASONE PROPIONATE 50 MCG/ACT NA SUSP: 2.0000 | Freq: Every day | NASAL | 6 refills | Status: DC

## 2017-06-16 MED ORDER — CETIRIZINE HCL 10 MG PO TABS: 10.0000 mg | ORAL_TABLET | Freq: Every day | ORAL | 11 refills | Status: DC

## 2017-06-16 NOTE — Progress Notes (Signed)
   Subjective:    Patient ID: Kendra Payne, female    DOB: March 02, 1976, 42 y.o.   MRN: [id number]  Cough  This is a new problem. The current episode started in the past 7 days. The problem has been unchanged. The cough is non-productive. Pertinent negatives include no chills, ear congestion, ear pain, fever, headaches, myalgias, rhinorrhea, sore throat or wheezing. The symptoms are aggravated by pollens and lying down. She has tried rest and OTC cough suppressant for the symptoms. The treatment provided mild relief. There is no history of asthma or COPD.      Review of Systems  Constitutional: Negative for chills and fever.  HENT: Negative for ear pain, rhinorrhea and sore throat.   Respiratory: Positive for cough. Negative for wheezing.   Musculoskeletal: Negative for myalgias.  Neurological: Negative for headaches.  All other systems reviewed and are negative.      Objective:   Physical Exam  Constitutional: She is oriented to person, place, and time. She appears well-developed and well-nourished. No distress.  HENT:  Head: Normocephalic and atraumatic.  Right Ear: External ear normal.  Nose: Mucosal edema and rhinorrhea present.  Mouth/Throat: Posterior oropharyngeal erythema present.  Eyes: Pupils are equal, round, and reactive to light.  Neck: Normal range of motion. Neck supple. No thyromegaly present.  Cardiovascular: Normal rate, regular rhythm, normal heart sounds and intact distal pulses.  No murmur heard. Pulmonary/Chest: Effort normal and breath sounds normal. No respiratory distress. She has no wheezes.  Intermittent nonproductive cough   Abdominal: Soft. Bowel sounds are normal. She exhibits no distension. There is no tenderness.  Musculoskeletal: Normal range of motion. She exhibits no edema or tenderness.  Neurological: She is alert and oriented to person, place, and time.  Skin: Skin is warm and dry.  Psychiatric: She has a normal mood and affect. Her  behavior is normal. Judgment and thought content normal.  Vitals reviewed.     BP 115/76   Pulse 67   Temp 99.1 F (37.3 C) (Oral)   Resp 20   Ht 5' 7.75" (1.721 m)   Wt 237 lb (107.5 kg)   SpO2 100%   BMI 36.30 kg/m      Assessment & Plan:  1. Viral upper respiratory tract infection - Take meds as prescribed - Use a cool mist humidifier  -Use saline nose sprays frequently -Force fluids -For any cough or congestion  Use plain Mucinex- regular strength or max strength is fine -For fever or aces or pains- take tylenol or ibuprofen. -Throat lozenges if help RTO prn - fluticasone (FLONASE) 50 MCG/ACT nasal spray; Place 2 sprays into both nostrils daily.  Dispense: 16 g; Refill: 6 - cetirizine (ZYRTEC) 10 MG tablet; Take 1 tablet (10 mg total) by mouth daily.  Dispense: 30 tablet; Refill: 11 - benzonatate (TESSALON) 200 MG capsule; Take 1 capsule (200 mg total) by mouth 3 (three) times daily as needed.  Dispense: 30 capsule; Refill: 1 - HYDROcodone-homatropine (HYCODAN) 5-1.5 MG/5ML syrup; Take 5 mLs by mouth every 8 (eight) hours as needed for cough.  Dispense: 120 mL; Refill: 0   Jannifer Rodneyhristy Phuong Hillary, FNP

## 2017-06-16 NOTE — Patient Instructions (Signed)
Upper Respiratory Infection, Adult Most upper respiratory infections (URIs) are caused by a virus. A URI affects the nose, throat, and upper air passages. The most common type of URI is often called "the common cold." Follow these instructions at home:  Take medicines only as told by your doctor.  Gargle warm saltwater or take cough drops to comfort your throat as told by your doctor.  Use a warm mist humidifier or inhale steam from a shower to increase air moisture. This may make it easier to breathe.  Drink enough fluid to keep your pee (urine) clear or pale yellow.  Eat soups and other clear broths.  Have a healthy diet.  Rest as needed.  Go back to work when your fever is gone or your doctor says it is okay. ? You may need to stay home longer to avoid giving your URI to others. ? You can also wear a face mask and wash your hands often to prevent spread of the virus.  Use your inhaler more if you have asthma.  Do not use any tobacco products, including cigarettes, chewing tobacco, or electronic cigarettes. If you need help quitting, ask your doctor. Contact a doctor if:  You are getting worse, not better.  Your symptoms are not helped by medicine.  You have chills.  You are getting more short of breath.  You have brown or red mucus.  You have yellow or brown discharge from your nose.  You have pain in your face, especially when you bend forward.  You have a fever.  You have puffy (swollen) neck glands.  You have pain while swallowing.  You have white areas in the back of your throat. Get help right away if:  You have very bad or constant: ? Headache. ? Ear pain. ? Pain in your forehead, behind your eyes, and over your cheekbones (sinus pain). ? Chest pain.  You have long-lasting (chronic) lung disease and any of the following: ? Wheezing. ? Long-lasting cough. ? Coughing up blood. ? A change in your usual mucus.  You have a stiff neck.  You have  changes in your: ? Vision. ? Hearing. ? Thinking. ? Mood. This information is not intended to replace advice given to you by your health care provider. Make sure you discuss any questions you have with your health care provider. Document Released: 08/10/2007 Document Revised: 10/25/2015 Document Reviewed: 05/29/2013 Elsevier Interactive Patient Education  2018 Elsevier Inc.  

## 2017-12-22 ENCOUNTER — Encounter: Payer: Self-pay | Admitting: Pediatrics

## 2017-12-22 ENCOUNTER — Ambulatory Visit (INDEPENDENT_AMBULATORY_CARE_PROVIDER_SITE_OTHER): Payer: No Typology Code available for payment source | Admitting: Pediatrics

## 2017-12-22 VITALS — BP 116/69 | HR 84 | Temp 96.7°F | Ht 67.75 in | Wt 232.0 lb

## 2017-12-22 DIAGNOSIS — M79605 Pain in left leg: Secondary | ICD-10-CM | POA: Diagnosis not present

## 2017-12-22 DIAGNOSIS — Z0000 Encounter for general adult medical examination without abnormal findings: Secondary | ICD-10-CM
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
## 2018-10-26 ENCOUNTER — Encounter: Payer: No Typology Code available for payment source | Admitting: Family Medicine

## 2019-01-11 ENCOUNTER — Other Ambulatory Visit: Payer: Self-pay

## 2019-01-14 ENCOUNTER — Encounter: Payer: No Typology Code available for payment source | Admitting: Family Medicine

## 2019-02-08 ENCOUNTER — Encounter: Payer: Self-pay | Admitting: Family Medicine

## 2019-02-08 ENCOUNTER — Ambulatory Visit (INDEPENDENT_AMBULATORY_CARE_PROVIDER_SITE_OTHER): Payer: No Typology Code available for payment source | Admitting: Family Medicine

## 2019-02-08 DIAGNOSIS — R12 Heartburn: Secondary | ICD-10-CM | POA: Diagnosis not present

## 2019-02-08 MED ORDER — CIMETIDINE 200 MG PO TABS
200.0000 mg | ORAL_TABLET | Freq: Two times a day (BID) | ORAL | 2 refills | Status: DC
Start: 2019-02-08 — End: 2019-03-06

## 2019-02-08 NOTE — Progress Notes (Signed)
   Virtual Visit via Telephone Note  I connected with Kendra Payne on 02/08/19 at 10:15 AM by telephone and verified that I am speaking with the correct person using two identifiers. Kendra Payne is currently located at work and nobody is currently with her during this visit. The provider, Loman Brooklyn, FNP is located in their home at time of visit.  I discussed the limitations, risks, security and privacy concerns of performing an evaluation and management service by telephone and the availability of in person appointments. I also discussed with the patient that there may be a patient responsible charge related to this service. The patient expressed understanding and agreed to proceed.  Subjective: PCP: No primary care provider on file.  Chief Complaint  Patient presents with  . Heartburn   GERD: Paitent complains of heartburn. This has been associated with belching.  She denies choking on food. Symptoms have been present for 1.5 months. She denies dysphagia.  She has not lost weight. She denies melena, hematochezia, hematemesis, and coffee ground emesis. Medical therapy in the past has included H2 antagonists (pepcid 20 mg once) and proton pump inhibitors (prilosec 20 mg once), but these were only once between yesterday and today.  She has also tried drinking milk.  Feels like this was set off last night by leftover vegetable beef stew.   ROS: Per HPI  Current Outpatient Medications:  .  NORTREL 7/7/7 0.5/0.75/1-35 MG-MCG tablet, , Disp: , Rfl:  .  valACYclovir (VALTREX) 1000 MG tablet, , Disp: , Rfl:   No Known Allergies History reviewed. No pertinent past medical history.  Observations/Objective: A&O  No respiratory distress or wheezing audible over the phone Mood, judgement, and thought processes all WNL  Assessment and Plan: 1. Heartburn - Education provided on food choices for GERD.  Discussed Tums, Mylanta, Maalox, and the differences between H2 blockers and  PPIs. - cimetidine (TAGAMET) 200 MG tablet; Take 1 tablet (200 mg total) by mouth 2 (two) times daily.  Dispense: 60 tablet; Refill: 2   Follow Up Instructions:  I discussed the assessment and treatment plan with the patient. The patient was provided an opportunity to ask questions and all were answered. The patient agreed with the plan and demonstrated an understanding of the instructions.   The patient was advised to call back or seek an in-person evaluation if the symptoms worsen or if the condition fails to improve as anticipated.  The above assessment and management plan was discussed with the patient. The patient verbalized understanding of and has agreed to the management plan. Patient is aware to call the clinic if symptoms persist or worsen. Patient is aware when to return to the clinic for a follow-up visit. Patient educated on when it is appropriate to go to the emergency department.   Time call ended: 10:25  I provided 12 minutes of non-face-to-face time during this encounter.  Hendricks Limes, MSN, APRN, FNP-C [facility] 02/08/19

## 2019-02-08 NOTE — Patient Instructions (Signed)

## 2019-03-06 ENCOUNTER — Ambulatory Visit (INDEPENDENT_AMBULATORY_CARE_PROVIDER_SITE_OTHER): Payer: No Typology Code available for payment source | Admitting: Family Medicine

## 2019-03-06 ENCOUNTER — Encounter: Payer: Self-pay | Admitting: Family Medicine

## 2019-03-06 DIAGNOSIS — R12 Heartburn: Secondary | ICD-10-CM | POA: Diagnosis not present

## 2019-03-06 MED ORDER — ESOMEPRAZOLE MAGNESIUM 20 MG PO CPDR: 20.0000 mg | DELAYED_RELEASE_CAPSULE | Freq: Every day | ORAL | 2 refills | Status: DC

## 2019-03-06 MED FILL — ESOMEPRAZOLE MAG DR 20 MG C: 20 | 30 days supply | Qty: 30 | Fill #0

## 2019-03-06 NOTE — Progress Notes (Signed)
   Virtual Visit via Telephone Note  I connected with Kendra Payne on 03/06/19 at 8:33 AM by telephone and verified that I am speaking with the correct person using two identifiers. Kendra Payne is currently located at home and nobody is currently with her during this visit. The provider, Loman Brooklyn, FNP is located in their home at time of visit.  I discussed the limitations, risks, security and privacy concerns of performing an evaluation and management service by telephone and the availability of in person appointments. I also discussed with the patient that there may be a patient responsible charge related to this service. The patient expressed understanding and agreed to proceed.  Subjective: PCP: Loman Brooklyn, FNP  Chief Complaint  Patient presents with  . Heartburn    Patient was started on Tagamet 200 mg twice daily almost 4 weeks ago.  She reports initially she felt like it was working for her but had an episode the other day where her heartburn was uncontrolled.  That day she had taken her Tagamet and also took Pepcid, Tums, and was drinking a lot of milk.  She continued to experience the burning in her chest and throat along with belching into the night.  It had resolved by the time she woke up the next morning.  She does feel she wakes up every day with symptoms.  She has been taking Nexium OTC for the past 2 days and has been well controlled.   ROS: Per HPI  Current Outpatient Medications:  .  cimetidine (TAGAMET) 200 MG tablet, Take 1 tablet (200 mg total) by mouth 2 (two) times daily., Disp: 60 tablet, Rfl: 2 .  NORTREL 7/7/7 0.5/0.75/1-35 MG-MCG tablet, , Disp: , Rfl:  .  valACYclovir (VALTREX) 1000 MG tablet, , Disp: , Rfl:   No Known Allergies History reviewed. No pertinent past medical history.  Observations/Objective: A&O  No respiratory distress or wheezing audible over the phone Mood, judgement, and thought processes all WNL  Assessment and  Plan: 1. Heartburn - Advised for the sake of limiting medications if she is going to take Nexium she should try it by itself and no longer take the Tagamet.  We did discuss that if she needs both it is safe to take both. - esomeprazole (NEXIUM) 20 MG capsule; Take 1 capsule (20 mg total) by mouth daily at 12 noon.  Dispense: 30 capsule; Refill: 2   Follow Up Instructions:  I discussed the assessment and treatment plan with the patient. The patient was provided an opportunity to ask questions and all were answered. The patient agreed with the plan and demonstrated an understanding of the instructions.   The patient was advised to call back or seek an in-person evaluation if the symptoms worsen or if the condition fails to improve as anticipated.  The above assessment and management plan was discussed with the patient. The patient verbalized understanding of and has agreed to the management plan. Patient is aware to call the clinic if symptoms persist or worsen. Patient is aware when to return to the clinic for a follow-up visit. Patient educated on when it is appropriate to go to the emergency department.   Time call ended: 8:43 AM  I provided 12 minutes of non-face-to-face time during this encounter.  Hendricks Limes, MSN, APRN, FNP-C [facility] 03/06/19

## 2019-03-11 ENCOUNTER — Telehealth: Payer: Self-pay | Admitting: Family Medicine

## 2019-03-11 DIAGNOSIS — R12 Heartburn: Secondary | ICD-10-CM

## 2019-03-11 NOTE — Telephone Encounter (Signed)
What symptoms do you have? Acid reflux, had another really bad attack yesterday and Nexium did not help  How long have you been sick? For awhile now  Have you been seen for this problem? Yes twice   If your provider decides to give you a prescription, which pharmacy would you like for it to be sent to? [facility], If prescribed something today please send to [facility]   Patient informed that this information will be sent to the clinical staff for review and that they should receive a follow up call.

## 2019-03-11 NOTE — Telephone Encounter (Signed)
Returning call from nurse. Requested to be called back. Call pt @ [phone]

## 2019-03-12 NOTE — Telephone Encounter (Signed)
Pt aware of provider feedback and voiced understanding. She is agreeable to referral to GI and states she doesn't have a preference.

## 2019-03-12 NOTE — Telephone Encounter (Signed)
Please have patient add back the Tagament twice daily in addition to the Nexium. Is she agreeable to a referral to a gastroenterologist? If yes please ask where she would like to be referred.

## 2019-03-18 ENCOUNTER — Encounter: Payer: Self-pay | Admitting: Internal Medicine

## 2019-03-27 ENCOUNTER — Telehealth: Payer: Self-pay | Admitting: Family Medicine

## 2019-03-27 NOTE — Telephone Encounter (Signed)
Please call patient and schedule appointment for routine office visit. She has not been seen in person since Dr. Oswaldo Done.
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
## 2019-04-03 ENCOUNTER — Other Ambulatory Visit: Payer: Self-pay

## 2019-04-03 ENCOUNTER — Ambulatory Visit: Payer: 59 | Admitting: Gastroenterology

## 2019-04-03 ENCOUNTER — Encounter: Payer: Self-pay | Admitting: Gastroenterology

## 2019-04-03 DIAGNOSIS — K219 Gastro-esophageal reflux disease without esophagitis: Secondary | ICD-10-CM | POA: Diagnosis not present

## 2019-04-03 DIAGNOSIS — R195 Other fecal abnormalities: Secondary | ICD-10-CM

## 2019-04-03 MED ORDER — DICYCLOMINE HCL 10 MG PO CAPS: 10.0000 mg | ORAL_CAPSULE | Freq: Three times a day (TID) | ORAL | 1 refills | Status: DC

## 2019-04-03 MED ORDER — PANTOPRAZOLE SODIUM 40 MG PO TBEC: 40.0000 mg | DELAYED_RELEASE_TABLET | Freq: Every day | ORAL | 5 refills | Status: DC

## 2019-04-03 MED FILL — PANTOPRAZOLE SOD DR 40 MG T: 40 | 30 days supply | Qty: 30 | Fill #0

## 2019-04-03 MED FILL — DICYCLOMINE 10 MG CAPSULE: 10 | 30 days supply | Qty: 120 | Fill #0

## 2019-04-03 NOTE — Patient Instructions (Addendum)
Stop Nexium and Tagamet.  Start Protonix 40 mg daily 30 minutes before breakfast.  Avoid fried, fatty, greasy, spicy, citrus foods.  Consume only lean meats. Limit the ground beef and sausage you are currently eating daily. Avoid caffeine, carbonated beverages, and chocolate.  Do not eat within 3 hours of laying down.  Use Bentyl 10 mg as needed up to 3 times daily 30 minutes before meals and at bedtime for abdominal cramping and loose stools.  Start with Bentyl once in the morning and see how that does.  Increase as needed. Hold in setting of constipation.   We will follow up with you in 3 months.  Call if you have questions or concerns prior.  Ermalinda Memos, PA-C [facility]   Food Choices for Gastroesophageal Reflux Disease, Adult When you have gastroesophageal reflux disease (GERD), the foods you eat and your eating habits are very important. Choosing the right foods can help ease your discomfort. Think about working with a nutrition specialist (dietitian) to help you make good choices. What are tips for following this plan?  Meals  Choose healthy foods that are low in fat, such as fruits, vegetables, whole grains, low-fat dairy products, and lean meat, fish, and poultry.  Eat small meals often instead of 3 large meals a day. Eat your meals slowly, and in a place where you are relaxed. Avoid bending over or lying down until 2-3 hours after eating.  Avoid eating meals 2-3 hours before bed.  Avoid drinking a lot of liquid with meals.  Cook foods using methods other than frying. Bake, grill, or broil food instead.  Avoid or limit: ? Chocolate. ? Peppermint or spearmint. ? Alcohol. ? Pepper. ? Black and decaffeinated coffee. ? Black and decaffeinated tea. ? Bubbly (carbonated) soft drinks. ? Caffeinated energy drinks and soft drinks.  Limit high-fat foods such as: ? Fatty meat or fried foods. ? Whole milk, cream, butter, or ice cream. ? Nuts and nut  butters. ? Pastries, donuts, and sweets made with butter or shortening.  Avoid foods that cause symptoms. These foods may be different for everyone. Common foods that cause symptoms include: ? Tomatoes. ? Oranges, lemons, and limes. ? Peppers. ? Spicy food. ? Onions and garlic. ? Vinegar. Lifestyle  Maintain a healthy weight. Ask your doctor what weight is healthy for you. If you need to lose weight, work with your doctor to do so safely.  Exercise for at least 30 minutes for 5 or more days each week, or as told by your doctor.  Wear loose-fitting clothes.  Do not smoke. If you need help quitting, ask your doctor.  Sleep with the head of your bed higher than your feet. Use a wedge under the mattress or blocks under the bed frame to raise the head of the bed. Summary  When you have gastroesophageal reflux disease (GERD), food and lifestyle choices are very important in easing your symptoms.  Eat small meals often instead of 3 large meals a day. Eat your meals slowly, and in a place where you are relaxed.  Limit high-fat foods such as fatty meat or fried foods.  Avoid bending over or lying down until 2-3 hours after eating.  Avoid peppermint and spearmint, caffeine, alcohol, and chocolate. This information is not intended to replace advice given to you by your health care provider. Make sure you discuss any questions you have with your health care provider. Document Revised: 06/14/2018 Document Reviewed: 03/29/2016 Elsevier Patient Education  2020 [employer].

## 2019-04-04 NOTE — Assessment & Plan Note (Signed)
Long history of postprandial loose stools with associated abdominal cramping that resolves after a BM. No alarm symptoms. Suspect IBS.   Will provide prescription of Bentyl 10 mg up to 3 times daily before meals and at bedtime as needed. She was advised to start with one tablet in the morning before breakfast and increase as needed. Hold in setting of constipation.   Follow-up in 3 months for GERD.

## 2019-04-04 NOTE — Progress Notes (Signed)
Cc'ed to pcp °

## 2019-04-04 NOTE — Assessment & Plan Note (Addendum)
History of intermittent heartburn that worsened 3 months ago. Currently taking Nexium 20 mg daily and Tagamet 200 mg twice daily.  This keeps symptoms fairly well controlled but states she did have a flare 4 days ago after eating pizza with significant heartburn along with RUQ and LUQ pain/burning.  Otherwise, she is not having breakthrough symptoms with this medication combination.  No alarm symptoms.  Eats sausage every morning, soda daily, ground beef most evenings for dinner. Abdominal exam is benign.   Suspect GERD is likely related to dietary habits/body habitus. Discussed diet/lifestyle adjustments as well as need for weight loss.   She is advised to avoid fried, fatty, greasy, spicy, citrus foods.  Lean meats only.  Avoid ground beef and sausage.  Avoid carbonated beverages, caffeine, and chocolate.  Do not eat within 3 hours of laying down. GERD handout provided.  Stop Tagamet and Nexium. Start Protonix 40 mg daily 30 minutes before breakfast. Follow-up in 3 months. Call if questions or concerns prior.

## 2019-04-26 MED FILL — DASETTA 7/7/7-28 TABLET: 0.5/0.75/1- | 84 days supply | Qty: 84 | Fill #0

## 2019-04-30 MED FILL — PANTOPRAZOLE SOD DR 40 MG T: 40 | 30 days supply | Qty: 30 | Fill #1

## 2019-05-26 NOTE — Patient Instructions (Signed)
Preventive Care 56-42 Years Old, Female Preventive care refers to visits with your health care provider and lifestyle choices that can promote health and wellness. This includes:  A yearly physical exam. This may also be called an annual well check.  Regular dental visits and eye exams.  Immunizations.  Screening for certain conditions.  Healthy lifestyle choices, such as eating a healthy diet, getting regular exercise, not using drugs or products that contain nicotine and tobacco, and limiting alcohol use. What can I expect for my preventive care visit? Physical exam Your health care provider will check your:  Height and weight. This may be used to calculate body mass index (BMI), which tells if you are at a healthy weight.  Heart rate and blood pressure.  Skin for abnormal spots. Counseling Your health care provider may ask you questions about your:  Alcohol, tobacco, and drug use.  Emotional well-being.  Home and relationship well-being.  Sexual activity.  Eating habits.  Work and work Statistician.  Method of birth control.  Menstrual cycle.  Pregnancy history. What immunizations do I need?  Influenza (flu) vaccine  This is recommended every year. Tetanus, diphtheria, and pertussis (Tdap) vaccine  You may need a Td booster every 10 years. Varicella (chickenpox) vaccine  You may need this if you have not been vaccinated. Zoster (shingles) vaccine  You may need this after age 46. Measles, mumps, and rubella (MMR) vaccine  You may need at least one dose of MMR if you were born in 1957 or later. You may also need a second dose. Pneumococcal conjugate (PCV13) vaccine  You may need this if you have certain conditions and were not previously vaccinated. Pneumococcal polysaccharide (PPSV23) vaccine  You may need one or two doses if you smoke cigarettes or if you have certain conditions. Meningococcal conjugate (MenACWY) vaccine  You may need this if  you have certain conditions. Hepatitis A vaccine  You may need this if you have certain conditions or if you travel or work in places where you may be exposed to hepatitis A. Hepatitis B vaccine  You may need this if you have certain conditions or if you travel or work in places where you may be exposed to hepatitis B. Haemophilus influenzae type b (Hib) vaccine  You may need this if you have certain conditions. Human papillomavirus (HPV) vaccine  If recommended by your health care provider, you may need three doses over 6 months. You may receive vaccines as individual doses or as more than one vaccine together in one shot (combination vaccines). Talk with your health care provider about the risks and benefits of combination vaccines. What tests do I need? Blood tests  Lipid and cholesterol levels. These may be checked every 5 years, or more frequently if you are over 62 years old.  Hepatitis C test.  Hepatitis B test. Screening  Lung cancer screening. You may have this screening every year starting at age 8 if you have a 30-pack-year history of smoking and currently smoke or have quit within the past 15 years.  Colorectal cancer screening. All adults should have this screening starting at age 30 and continuing until age 97. Your health care provider may recommend screening at age 66 if you are at increased risk. You will have tests every 1-10 years, depending on your results and the type of screening test.  Diabetes screening. This is done by checking your blood sugar (glucose) after you have not eaten for a while (fasting). You may have  this done every 1-3 years.  Mammogram. This may be done every 1-2 years. Talk with your health care provider about when you should start having regular mammograms. This may depend on whether you have a family history of breast cancer.  BRCA-related cancer screening. This may be done if you have a family history of breast, ovarian, tubal, or  peritoneal cancers.  Pelvic exam and Pap test. This may be done every 3 years starting at age 21. Starting at age 30, this may be done every 5 years if you have a Pap test in combination with an HPV test. Other tests  Sexually transmitted disease (STD) testing.  Bone density scan. This is done to screen for osteoporosis. You may have this scan if you are at high risk for osteoporosis. Follow these instructions at home: Eating and drinking  Eat a diet that includes fresh fruits and vegetables, whole grains, lean protein, and low-fat dairy.  Take vitamin and mineral supplements as recommended by your health care provider.  Do not drink alcohol if: ? Your health care provider tells you not to drink. ? You are pregnant, may be pregnant, or are planning to become pregnant.  If you drink alcohol: ? Limit how much you have to 0-1 drink a day. ? Be aware of how much alcohol is in your drink. In the [place] one drink equals one 12 oz bottle of beer (355 mL), one 5 oz glass of wine (148 mL), or one 1 oz glass of hard liquor (44 mL). Lifestyle  Take daily care of your teeth and gums.  Stay active. Exercise for at least 30 minutes on 5 or more days each week.  Do not use any products that contain nicotine or tobacco, such as cigarettes, e-cigarettes, and chewing tobacco. If you need help quitting, ask your health care provider.  If you are sexually active, practice safe sex. Use a condom or other form of birth control (contraception) in order to prevent pregnancy and STIs (sexually transmitted infections).  If told by your health care provider, take low-dose aspirin daily starting at age 50. What's next?  Visit your health care provider once a year for a well check visit.  Ask your health care provider how often you should have your eyes and teeth checked.  Stay up to date on all vaccines. This information is not intended to replace advice given to you by your health care provider. Make  sure you discuss any questions you have with your health care provider. Document Revised: 11/02/2017 Document Reviewed: 11/02/2017 Elsevier Patient Education  2020 Elsevier Inc.   

## 2019-05-26 NOTE — Progress Notes (Signed)
Assessment & Plan:  1. Well adult exam - Preventive health education provided. Requesting records for mammogram and pap smear. UTD with TDAP and influenza. Declined HIV screening.  - CBC with Differential/Platelet - CMP14+EGFR - Lipid panel  2. Obesity (BMI 30.0-34.9) - Continue exercise and healthy eating.  - phentermine 37.5 MG capsule; Take 1 capsule (37.5 mg total) by mouth every morning.  Dispense: 30 capsule; Refill: 1  3. History of cold sores - valACYclovir (VALTREX) 1000 MG tablet; Take 2 tablets (2,000 mg total) by mouth every 12 (twelve) hours for 1 day. Use as needed.  Dispense: 20 tablet; Refill: 0   Follow-up: Return in about 4 weeks (around 06/26/2019) for weight.   Hendricks Limes, MSN, APRN, FNP-C [facility]  Subjective:  Patient ID: Kendra Payne, female    DOB: 02-20-1976  Age: 44 y.o. MRN: [id number]  Patient Care Team: Loman Brooklyn, FNP as PCP - General (Family Medicine) Gala Romney Cristopher Estimable, MD as Consulting Physician (Gastroenterology)   CC:  Chief Complaint  Patient presents with  . Annual Exam    no pap    HPI BRITANY Payne presents for her annual physical.   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 2/9 Scores 05/29/2019 12/22/2017 06/16/2017 01/12/2017  PHQ - 2 Score 0 0 0 0     Review of Systems  Constitutional: Negative for chills, fever, malaise/fatigue and weight loss.  HENT: Negative for congestion, ear discharge, ear pain, nosebleeds, sinus pain, sore throat and tinnitus.   Eyes: Negative for blurred vision,  double vision, pain, discharge and redness.  Respiratory: Negative for cough, shortness of breath and wheezing.   Cardiovascular: Negative for chest pain, palpitations and leg swelling.  Gastrointestinal: Negative for abdominal pain, constipation, diarrhea, heartburn, nausea and vomiting.  Genitourinary: Negative for dysuria, frequency and urgency.  Musculoskeletal: Negative for myalgias.  Skin: Negative for rash.  Neurological: Negative for dizziness, seizures, weakness and headaches.  Psychiatric/Behavioral: Negative for depression, substance abuse and suicidal ideas. The patient is not nervous/anxious.     Current Outpatient Medications:  .  dicyclomine (BENTYL) 10 MG capsule, Take 1 capsule (10 mg total) by mouth 4 (four) times daily -  before meals and at bedtime., Disp: 120 capsule, Rfl: 1 .  NORTREL 7/7/7 0.5/0.75/1-35 MG-MCG tablet, daily. , Disp: , Rfl:  .  pantoprazole (PROTONIX) 40 MG tablet, Take 1 tablet (40 mg total) by mouth daily before breakfast., Disp: 30 tablet, Rfl: 5 .  phentermine 37.5 MG capsule, Take 1 capsule (37.5 mg total) by mouth every morning., Disp: 30 capsule, Rfl: 1 .  valACYclovir (VALTREX) 1000 MG tablet, Take 2 tablets (2,000 mg total) by mouth every 12 (twelve) hours for 1 day. Use as needed., Disp: 20 tablet, Rfl: 0  No Known Allergies  History reviewed. No pertinent past medical history.  Past Surgical History:  Procedure Laterality Date  . FRACTURE SURGERY  08/26/1998   Left Femur    Family History  Problem Relation Age of Onset  . Cancer Maternal Grandmother   . Diabetes Maternal Grandmother   . Cancer  Maternal Grandfather   . Diabetes Maternal Grandfather   . Colon cancer Neg Hx     Social History   Socioeconomic History  . Marital status: Married    Spouse name: Kendra Payne   . Number of children: 3  . Years of education: Not on file  . Highest education level: Associate degree: academic program  Occupational History  . 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 Smoking status: Never Smoker  . Smokeless tobacco: Never Used  Substance and Sexual Activity  . Alcohol use: No  . Drug use: No  . Sexual activity: Yes    Birth control/protection: Pill  Other Topics Concern  . Not on file  Social History Narrative  . Not on file   Social Determinants of Health   Financial Resource Strain:   . Difficulty of Paying Living Expenses:   Food Insecurity:   . Worried About Charity fundraiser in the Last Year:   . Arboriculturist in the Last Year:   Transportation Needs:   . Film/video editor (Medical):   Marland Kitchen Lack of Transportation (Non-Medical):   Physical Activity:   . Days of Exercise per Week:   . Minutes of Exercise per Session:   Stress:   . Feeling of Stress :   Social Connections:   . Frequency of Communication with Friends and Family:   . Frequency of Social Gatherings with Friends and Family:   . Attends Religious Services:   . Active Member of Clubs or Organizations:   . Attends Archivist Meetings:   Marland Kitchen Marital Status:   Intimate Partner Violence:   . Fear of Current or Ex-Partner:   . Emotionally Abused:   Marland Kitchen Physically Abused:   . Sexually Abused:       Objective:    BP (!) 110/96   Pulse 72   Temp 98 F (36.7 C) (Temporal)   Ht '5\' 8"'  (1.727 m)   Wt 225 lb 3.2 oz (102.2 kg)   LMP 05/22/2019 (Approximate)   SpO2 100%   BMI 34.24 kg/m   Wt Readings from Last 3 Encounters:  05/29/19 225 lb 3.2 oz (102.2 kg)  04/03/19 231 lb 6.4 oz (105 kg)  12/22/17 232 lb (105.2 kg)    Physical Exam Vitals reviewed.  Constitutional:      General: She is not in acute distress.    Appearance: Normal appearance. She is obese. She is not ill-appearing, toxic-appearing or diaphoretic.  HENT:     Head: Normocephalic and atraumatic.     Right Ear: Tympanic membrane, ear canal and external ear normal. There is no impacted cerumen.       Left Ear: Tympanic membrane, ear canal and external ear normal. There is no impacted cerumen.     Nose: Nose normal. No congestion or rhinorrhea.     Mouth/Throat:     Mouth: Mucous membranes are moist.     Pharynx: Oropharynx is clear. No oropharyngeal exudate or posterior oropharyngeal erythema.  Eyes:     General: No scleral icterus.       Right eye: No discharge.        Left eye: No discharge.     Conjunctiva/sclera: Conjunctivae normal.     Pupils: Pupils are equal, round, and reactive to light.  Cardiovascular:     Rate and Rhythm: Normal rate and regular rhythm.     Heart sounds:  Normal heart sounds. No murmur. No friction rub. No gallop.   Pulmonary:     Effort: Pulmonary effort is normal. No respiratory distress.     Breath sounds: Normal breath sounds. No stridor. No wheezing, rhonchi or rales.  Abdominal:     General: Abdomen is flat. Bowel sounds are normal. There is no distension.     Palpations: Abdomen is soft. There is no mass.     Tenderness: There is no abdominal tenderness. There is no guarding or rebound.     Hernia: No hernia is present.  Musculoskeletal:        General: Normal range of motion.     Cervical back: Normal range of motion and neck supple. No rigidity. No muscular tenderness.  Lymphadenopathy:     Cervical: No cervical adenopathy.  Skin:    General: Skin is warm and dry.     Capillary Refill: Capillary refill takes less than 2 seconds.  Neurological:     General: No focal deficit present.     Mental Status: She is alert and oriented to person, place, and time. Mental status is at baseline.  Psychiatric:        Mood and Affect: Mood normal.        Behavior: Behavior normal.        Thought Content: Thought content normal.        Judgment: Judgment normal.    Lab Results  Component Value Date   TSH 1.450 01/12/2017   Lab Results  Component Value Date   WBC 9.9 01/12/2017   HGB 12.7 01/12/2017   HCT 38.7 01/12/2017   MCV 91  01/12/2017   PLT 385 (H) 01/12/2017   Lab Results  Component Value Date   NA 137 01/12/2017   K 4.7 01/12/2017   CO2 24 01/12/2017   GLUCOSE 83 01/12/2017   BUN 10 01/12/2017   CREATININE 0.82 01/12/2017   BILITOT <0.2 01/12/2017   ALKPHOS 69 01/12/2017   AST 15 01/12/2017   ALT 11 01/12/2017   PROT 7.2 01/12/2017   ALBUMIN 4.2 01/12/2017   CALCIUM 9.5 01/12/2017   Lab Results  Component Value Date   CHOL 174 01/12/2017   Lab Results  Component Value Date   HDL 46 01/12/2017   Lab Results  Component Value Date   LDLCALC 103 (H) 01/12/2017   Lab Results  Component Value Date   TRIG 125 01/12/2017   Lab Results  Component Value Date   CHOLHDL 3.8 01/12/2017   No results found for: HGBA1C

## 2019-05-29 ENCOUNTER — Other Ambulatory Visit: Payer: Self-pay

## 2019-05-29 ENCOUNTER — Ambulatory Visit (INDEPENDENT_AMBULATORY_CARE_PROVIDER_SITE_OTHER): Payer: No Typology Code available for payment source | Admitting: Family Medicine

## 2019-05-29 ENCOUNTER — Encounter: Payer: Self-pay | Admitting: Family Medicine

## 2019-05-29 VITALS — BP 110/96 | HR 72 | Temp 98.0°F | Ht 68.0 in | Wt 225.2 lb

## 2019-05-29 DIAGNOSIS — Z0001 Encounter for general adult medical examination with abnormal findings: Secondary | ICD-10-CM

## 2019-05-29 DIAGNOSIS — Z8619 Personal history of other infectious and parasitic diseases: Secondary | ICD-10-CM | POA: Diagnosis not present

## 2019-05-29 DIAGNOSIS — Z0000 Encounter for general adult medical examination without abnormal findings: Secondary | ICD-10-CM

## 2019-05-29 DIAGNOSIS — E669 Obesity, unspecified: Secondary | ICD-10-CM | POA: Diagnosis not present

## 2019-05-29 MED ORDER — PHENTERMINE HCL 37.5 MG PO CAPS: 37.5000 mg | ORAL_CAPSULE | ORAL | 1 refills | Status: DC

## 2019-05-29 MED ORDER — VALACYCLOVIR HCL 1 G PO TABS: 2000.0000 mg | ORAL_TABLET | Freq: Two times a day (BID) | ORAL | 0 refills | Status: DC

## 2019-05-29 MED FILL — valACYclovir HCL 1 GM TABS: 1 | 10 days supply | Qty: 20 | Fill #0

## 2019-05-29 MED FILL — PHENTERMINE 37.5 MG TABLET: 37.5 | 30 days supply | Qty: 30 | Fill #0

## 2019-05-30 LAB — CMP14+EGFR
ALT: 13 IU/L (ref 0–32)
AST: 13 IU/L (ref 0–40)
Albumin/Globulin Ratio: 1.4 (ref 1.2–2.2)
Albumin: 4.2 g/dL (ref 3.8–4.8)
Alkaline Phosphatase: 62 IU/L (ref 39–117)
BUN/Creatinine Ratio: 11 (ref 9–23)
BUN: 10 mg/dL (ref 6–24)
Bilirubin Total: 0.3 mg/dL (ref 0.0–1.2)
CO2: 22 mmol/L (ref 20–29)
Calcium: 9.2 mg/dL (ref 8.7–10.2)
Chloride: 102 mmol/L (ref 96–106)
Creatinine, Ser: 0.94 mg/dL (ref 0.57–1.00)
GFR calc Af Amer: 87 mL/min/{1.73_m2} (ref >59)
GFR calc non Af Amer: 76 mL/min/{1.73_m2} (ref >59)
Globulin, Total: 3 g/dL (ref 1.5–4.5)
Glucose: 93 mg/dL (ref 65–99)
Potassium: 4.4 mmol/L (ref 3.5–5.2)
Sodium: 138 mmol/L (ref 134–144)
Total Protein: 7.2 g/dL (ref 6.0–8.5)

## 2019-05-30 LAB — CBC WITH DIFFERENTIAL/PLATELET
Basophils Absolute: 0 10*3/uL (ref 0.0–0.2)
Basos: 0 %
EOS (ABSOLUTE): 0.1 10*3/uL (ref 0.0–0.4)
Eos: 1 %
Hematocrit: 40 % (ref 34.0–46.6)
Hemoglobin: 12.8 g/dL (ref 11.1–15.9)
Immature Grans (Abs): 0 10*3/uL (ref 0.0–0.1)
Immature Granulocytes: 0 %
Lymphocytes Absolute: 2.1 10*3/uL (ref 0.7–3.1)
Lymphs: 31 %
MCH: 28.4 pg (ref 26.6–33.0)
MCHC: 32 g/dL (ref 31.5–35.7)
MCV: 89 fL (ref 79–97)
Monocytes Absolute: 0.5 10*3/uL (ref 0.1–0.9)
Monocytes: 7 %
Neutrophils Absolute: 4.2 10*3/uL (ref 1.4–7.0)
Neutrophils: 61 %
Platelets: 369 10*3/uL (ref 150–450)
RBC: 4.5 x10E6/uL (ref 3.77–5.28)
RDW: 12.6 % (ref 11.7–15.4)
WBC: 6.9 10*3/uL (ref 3.4–10.8)

## 2019-05-30 LAB — LIPID PANEL
Chol/HDL Ratio: 3.7 ratio (ref 0.0–4.4)
Cholesterol, Total: 156 mg/dL (ref 100–199)
HDL: 42 mg/dL (ref >39)
LDL Chol Calc (NIH): 96 mg/dL (ref 0–99)
Triglycerides: 96 mg/dL (ref 0–149)
VLDL Cholesterol Cal: 18 mg/dL (ref 5–40)

## 2019-06-03 MED FILL — PANTOPRAZOLE SOD DR 40 MG T: 40 | 30 days supply | Qty: 30 | Fill #2

## 2019-07-01 NOTE — Progress Notes (Signed)
Assessment & Plan:  1. Obesity (BMI 30.0-34.9) - Doing well with diet, exercise, and phentermine. She is losing weight and feeling good. Continue current plan.    Return in about 4 weeks (around 07/30/2019) for weight.  Hendricks Limes, MSN, APRN, FNP-C [facility]  Subjective:    Patient ID: Kendra Payne, female    DOB: [birth year], 44 y.o.   MRN: [id number]  Patient Care Team: Loman Brooklyn, FNP as PCP - General (Family Medicine) Gala Romney Cristopher Estimable, MD as Consulting Physician (Gastroenterology)   Chief Complaint:  Chief Complaint  Patient presents with  . Weight Check    1 month follow up    HPI: Kendra Payne is a 44 y.o. female presenting on 07/02/2019 for Weight Check (1 month follow up)  Patient is here for follow-up of the weight.  She was started on phentermine a month ago and was also to continue diet and exercise. She has been doing Weight Watchers. She has lost 8 lbs since her last visit. Denies any chest pain, palpitations, or tachycardia.   New complaints: None  Social history:  Relevant past medical, surgical, family and social history reviewed and updated as indicated. Interim medical history since our last visit reviewed.  Allergies and medications reviewed and updated.  DATA REVIEWED: CHART IN EPIC  ROS: Negative unless specifically indicated above in HPI.    Current Outpatient Medications:  .  dicyclomine (BENTYL) 10 MG capsule, Take 1 capsule (10 mg total) by mouth 4 (four) times daily -  before meals and at bedtime., Disp: 120 capsule, Rfl: 1 .  NORTREL 7/7/7 0.5/0.75/1-35 MG-MCG tablet, daily. , Disp: , Rfl:  .  pantoprazole (PROTONIX) 40 MG tablet, Take 1 tablet (40 mg total) by mouth daily before breakfast., Disp: 30 tablet, Rfl: 5 .  phentermine 37.5 MG capsule, Take 1 capsule (37.5 mg total) by mouth every morning., Disp: 30 capsule, Rfl: 1   No Known Allergies History reviewed. No pertinent past medical history.   Past Surgical History:  Procedure Laterality Date  . FRACTURE SURGERY  08/26/1998   Left Femur    Social History   Socioeconomic History  . Marital status: Married    Spouse name: Kendra Payne   . Number of children: 3  . Years of education: Not on file  . Highest education level: Associate degree: academic program  Occupational History  . Occupation: Investment banker, operational: [employer]  Tobacco Use  . Smoking status: Never Smoker  . Smokeless tobacco: Never Used  Substance and Sexual Activity  . Alcohol use: No  . Drug use: No  . Sexual activity: Yes    Birth control/protection: Pill  Other Topics Concern  . Not on file  Social History Narrative  . Not on file   Social Determinants of Health   Financial Resource Strain:   . Difficulty of Paying Living Expenses:   Food Insecurity:   . Worried About Charity fundraiser in the Last Year:   . Arboriculturist in the Last Year:   Transportation Needs:   . Film/video editor (Medical):   Marland Kitchen Lack of Transportation (Non-Medical):   Physical Activity:   . Days of Exercise per Week:   . Minutes of Exercise per Session:   Stress:   . Feeling of Stress :   Social Connections:   . Frequency of Communication with Friends and Family:   . Frequency  of Social Gatherings with Friends and Family:   . Attends Religious Services:   . Active Member of Clubs or Organizations:   . Attends Banker Meetings:   Marland Kitchen Marital Status:   Intimate Partner Violence:   . Fear of Current or Ex-Partner:   . Emotionally Abused:   Marland Kitchen Physically Abused:   . Sexually Abused:         Objective:    BP 113/79   Pulse 67   Temp 97.8 F (36.6 C) (Temporal)   Ht 5\' 8"  (1.727 m)   Wt 217 lb 3.2 oz (98.5 kg)   LMP 06/18/2019 (Approximate)   SpO2 100%   BMI 33.03 kg/m   Wt Readings from Last 3 Encounters:  07/02/19 217 lb 3.2 oz (98.5 kg)  05/29/19 225 lb 3.2 oz (102.2 kg)  04/03/19 231 lb 6.4  oz (105 kg)    Physical Exam Vitals reviewed.  Constitutional:      General: She is not in acute distress.    Appearance: Normal appearance. She is obese. She is not ill-appearing, toxic-appearing or diaphoretic.  HENT:     Head: Normocephalic and atraumatic.  Eyes:     General: No scleral icterus.       Right eye: No discharge.        Left eye: No discharge.     Conjunctiva/sclera: Conjunctivae normal.  Cardiovascular:     Rate and Rhythm: Normal rate and regular rhythm.     Heart sounds: Normal heart sounds. No murmur. No friction rub. No gallop.   Pulmonary:     Effort: Pulmonary effort is normal. No respiratory distress.     Breath sounds: Normal breath sounds. No stridor. No wheezing, rhonchi or rales.  Musculoskeletal:        General: Normal range of motion.     Cervical back: Normal range of motion.  Skin:    General: Skin is warm and dry.     Capillary Refill: Capillary refill takes less than 2 seconds.  Neurological:     General: No focal deficit present.     Mental Status: She is alert and oriented to person, place, and time. Mental status is at baseline.  Psychiatric:        Mood and Affect: Mood normal.        Behavior: Behavior normal.        Thought Content: Thought content normal.        Judgment: Judgment normal.     Lab Results  Component Value Date   TSH 1.450 01/12/2017   Lab Results  Component Value Date   WBC 6.9 05/29/2019   HGB 12.8 05/29/2019   HCT 40.0 05/29/2019   MCV 89 05/29/2019   PLT 369 05/29/2019   Lab Results  Component Value Date   NA 138 05/29/2019   K 4.4 05/29/2019   CO2 22 05/29/2019   GLUCOSE 93 05/29/2019   BUN 10 05/29/2019   CREATININE 0.94 05/29/2019   BILITOT 0.3 05/29/2019   ALKPHOS 62 05/29/2019   AST 13 05/29/2019   ALT 13 05/29/2019   PROT 7.2 05/29/2019   ALBUMIN 4.2 05/29/2019   CALCIUM 9.2 05/29/2019   Lab Results  Component Value Date   CHOL 156 05/29/2019   Lab Results  Component Value Date    HDL 42 05/29/2019   Lab Results  Component Value Date   LDLCALC 96 05/29/2019   Lab Results  Component Value Date   TRIG 96 05/29/2019  Lab Results  Component Value Date   CHOLHDL 3.7 05/29/2019   No results found for: HGBA1C

## 2019-07-02 ENCOUNTER — Other Ambulatory Visit: Payer: Self-pay

## 2019-07-02 ENCOUNTER — Encounter: Payer: Self-pay | Admitting: Family Medicine

## 2019-07-02 ENCOUNTER — Ambulatory Visit (INDEPENDENT_AMBULATORY_CARE_PROVIDER_SITE_OTHER): Payer: No Typology Code available for payment source | Admitting: Family Medicine

## 2019-07-02 VITALS — BP 113/79 | HR 67 | Temp 97.8°F | Ht 68.0 in | Wt 217.2 lb

## 2019-07-02 DIAGNOSIS — E669 Obesity, unspecified: Secondary | ICD-10-CM

## 2019-07-02 MED FILL — DICYCLOMINE 10 MG CAPSULE: 10 | 30 days supply | Qty: 120 | Fill #1

## 2019-07-02 MED FILL — PANTOPRAZOLE SOD DR 40 MG T: 40 | 30 days supply | Qty: 30 | Fill #3

## 2019-07-02 MED FILL — PHENTERMINE 37.5 MG TABLET: 37.5 | 30 days supply | Qty: 30 | Fill #1

## 2019-07-02 NOTE — Progress Notes (Signed)
Referring Provider: Loman Brooklyn, FNP Primary Care Physician:  Loman Brooklyn, FNP Primary GI Physician: Dr. Gala Romney  Chief Complaint  Patient presents with  . Gastroesophageal Reflux    doing fine    HPI:   Kendra Payne is a 44 y.o. female presenting today for follow-up of GERD and postprandial loose stools.  She was last seen at the time of initial consult on 04/03/2019 for the same.    At her last visit, she reported long history of intermittent heartburn that have become more frequent over the last 3 months.  She was taking Nexium 20 mg daily and Tagamet 200 mg twice daily which controlled symptoms fairly well but had a flare 4 days prior to office visit after eating pizza.  No alarm symptoms.  Suspected GERD was related to body habitus and dietary habits as she reported eating sausage daily, soda daily, ground beef most evenings for dinner.  Discussed diet and lifestyle adjustments as well as need for weight loss.  She was started on Protonix 40 mg daily before breakfast.  Additionally, she reported long history of postprandial loose stools associated abdominal cramping that resolved after a BM.  No alarm symptoms.  Suspect IBS.  She was started on Bentyl 10 mg up to 3 times daily before meals and at bedtime as needed.  In the interim, it appears patient was started on phentermine by PCP to assist with weight loss. Labs completed 05/29/2019 with CBC and CMP within normal limits.  Today: Doing weight watchers and working out 4 times a week. Also started Phentermine. Lost about 15 lbs since I last saw her.  GERD: Taking Protonix 40 mg daily. No breakthrough symptoms. No abdominal pain. No dysphagia. No N/V.    Loose Stools: Taking Bentyl once in the morning. When she does go, it is solid. Typically having 1 stool daily to every to every other day. No constipation. No abdominal cramping. No blood in the stool or black stool.    History reviewed. No pertinent past medical  history.  Past Surgical History:  Procedure Laterality Date  . FRACTURE SURGERY  08/26/1998   Left Femur    Current Outpatient Medications  Medication Sig Dispense Refill  . dicyclomine (BENTYL) 10 MG capsule Take 1 capsule (10 mg total) by mouth 4 (four) times daily -  before meals and at bedtime. 120 capsule 1  . NORTREL 7/7/7 0.5/0.75/1-35 MG-MCG tablet daily.     . pantoprazole (PROTONIX) 40 MG tablet Take 1 tablet (40 mg total) by mouth daily before breakfast. 30 tablet 5  . phentermine 37.5 MG capsule Take 1 capsule (37.5 mg total) by mouth every morning. 30 capsule 1   No current facility-administered medications for this visit.    Allergies as of 07/03/2019  . (No Known Allergies)    Family History  Problem Relation Age of Onset  . Cancer Maternal Grandmother   . Diabetes Maternal Grandmother   . Cancer Maternal Grandfather   . Diabetes Maternal Grandfather   . Colon cancer Neg Hx     Social History   Socioeconomic History  . Marital status: Married    Spouse name: [name]   . Number of children: 3  . Years of education: Not on file  . Highest education level: Associate degree: academic program  Occupational History  . Occupation: Investment banker, operational: [employer]  Tobacco Use  . Smoking status: Never Smoker  .  Smokeless tobacco: Never Used  Substance and Sexual Activity  . Alcohol use: No  . Drug use: No  . Sexual activity: Yes    Birth control/protection: Pill  Other Topics Concern  . Not on file  Social History Narrative  . Not on file   Social Determinants of Health   Financial Resource Strain:   . Difficulty of Paying Living Expenses:   Food Insecurity:   . Worried About Programme researcher, broadcasting/film/video in the Last Year:   . Barista in the Last Year:   Transportation Needs:   . Freight forwarder (Medical):   Marland Kitchen Lack of Transportation (Non-Medical):   Physical Activity:   . Days of Exercise per Week:    . Minutes of Exercise per Session:   Stress:   . Feeling of Stress :   Social Connections:   . Frequency of Communication with Friends and Family:   . Frequency of Social Gatherings with Friends and Family:   . Attends Religious Services:   . Active Member of Clubs or Organizations:   . Attends Banker Meetings:   Marland Kitchen Marital Status:     Review of Systems: Gen: Denies fever, chills, lightheadedness, dizziness, presyncope, syncope. CV: Denies chest pain or heart palpitations. Resp: Denies dyspnea or cough. GI: See HPI Derm: Denies rash Heme: See HPI  Physical Exam: BP 118/75   Pulse 65   Temp (!) 97.3 F (36.3 C) (Oral)   Ht 5\' 8"  (1.727 m)   Wt 216 lb 9.6 oz (98.2 kg)   LMP 06/18/2019 (Approximate)   BMI 32.93 kg/m  General:   Alert and oriented. No distress noted. Pleasant and cooperative.  Head:  Normocephalic and atraumatic. Eyes:  Conjuctiva clear without scleral icterus. Heart:  S1, S2 present without murmurs appreciated. Lungs:  Clear to auscultation bilaterally. No wheezes, rales, or rhonchi. No distress.  Abdomen:  +BS, soft, non-tender and non-distended. No rebound or guarding. No HSM or masses noted. Msk:  Symmetrical without gross deformities. Normal posture. Extremities:  Without edema. Neurologic:  Alert and  oriented x4 Psych: Normal mood and affect.

## 2019-07-03 ENCOUNTER — Ambulatory Visit (INDEPENDENT_AMBULATORY_CARE_PROVIDER_SITE_OTHER): Payer: No Typology Code available for payment source | Admitting: Gastroenterology

## 2019-07-03 ENCOUNTER — Encounter: Payer: Self-pay | Admitting: Gastroenterology

## 2019-07-03 VITALS — BP 118/75 | HR 65 | Temp 97.3°F | Ht 68.0 in | Wt 216.6 lb

## 2019-07-03 DIAGNOSIS — K219 Gastro-esophageal reflux disease without esophagitis: Secondary | ICD-10-CM

## 2019-07-03 DIAGNOSIS — R195 Other fecal abnormalities: Secondary | ICD-10-CM

## 2019-07-03 NOTE — Assessment & Plan Note (Signed)
Patient had long history of postprandial loose stools with associated abdominal cramping that resolved after a BM.  No alarm symptoms.  Suspected IBS.  She was started on Bentyl 10 mg which she is currently taking once daily and has had resolution of loose stools and abdominal pain.  Currently with 1 soft formed stool daily to every other day.  She will continue with Bentyl 10 mg daily.  Advised to hold in the setting of constipation.   Follow-up in 6 months.

## 2019-07-03 NOTE — Progress Notes (Signed)
Cc'ed to pcp °

## 2019-07-03 NOTE — Assessment & Plan Note (Addendum)
Much improved/resolved on Protonix 40 mg daily.  No alarm symptoms.  She has also made dietary adjustments, is following weight watchers, working out 4 times a week, and has also started phentermine per PCP with 15 pound weight loss in the last 3 months.   She will continue Protonix 40 mg daily for now  In the future, we may be able to decrease Protonix dosage or discontinue altogether as had suspected symptoms were related to dietary habits and body habitus.  Recommended she continue her weight loss efforts and following a GERD diet.  She was congratulated on her success thus far. Follow-up in 6 months.

## 2019-07-03 NOTE — Patient Instructions (Signed)
Continue taking Protonix 40 mg 30 minutes before breakfast.  Continue following a GERD diet.  Avoiding fried, fatty, greasy, spicy, citrus foods.  Limit caffeine and carbonated beverages.  Do not eat within 3 hours of laying down.  Continue with your weight loss efforts.  Congratulations on your success thus far!  Continue taking Bentyl 10 mg once in the morning as this is working well to control your loose stools and abdominal cramping.  Should she have constipation, hold Bentyl.  We will see you back in 6 months.  Call with questions or concerns prior.  Kendra Memos, PA-C [facility]

## 2019-07-10 MED FILL — CHLORHEXIDINE 0.12% RINSE: 0.12 | 17 days supply | Qty: 473 | Fill #0

## 2019-07-10 MED FILL — AMOXICILLIN 500 MG CAPSULE: 500 | 10 days supply | Qty: 30 | Fill #0

## 2019-07-24 ENCOUNTER — Encounter: Payer: Self-pay | Admitting: Family Medicine

## 2019-07-29 ENCOUNTER — Telehealth: Payer: Self-pay | Admitting: Family Medicine

## 2019-07-29 DIAGNOSIS — E669 Obesity, unspecified: Secondary | ICD-10-CM

## 2019-07-29 NOTE — Telephone Encounter (Signed)
  Prescription Request  07/29/2019  What is the name of the medication or equipment? phentermine 37.5 MG capsule  Have you contacted your pharmacy to request a refill? (if applicable) no  Which pharmacy would you like this sent to? [facility]  Pt had to reschedule apt to 08/23/2019  Patient notified that their request is being sent to the clinical staff for review and that they should receive a response within 2 business days.

## 2019-07-29 NOTE — Telephone Encounter (Signed)
Pt has appt with you 08/23/19 but she will be out of the phentermine so she wanted to see if you would refill it before her appt. Pt originally had appt 5/26 but had to cancel due to work.

## 2019-07-30 MED ORDER — PHENTERMINE HCL 37.5 MG PO CAPS: 37.5000 mg | ORAL_CAPSULE | ORAL | 0 refills | Status: DC

## 2019-07-30 NOTE — Telephone Encounter (Signed)
Patient aware.

## 2019-07-30 NOTE — Telephone Encounter (Signed)
I sent 30 tablets. She will need to be seen for future refills.

## 2019-07-31 ENCOUNTER — Ambulatory Visit: Payer: No Typology Code available for payment source | Admitting: Family Medicine

## 2019-07-31 MED FILL — PANTOPRAZOLE SOD DR 40 MG T: 40 | 30 days supply | Qty: 30 | Fill #4

## 2019-07-31 MED FILL — PHENTERMINE HCL 37.5 MG CAP: 37.5 | 30 days supply | Qty: 30 | Fill #0

## 2019-08-14 ENCOUNTER — Other Ambulatory Visit: Payer: Self-pay | Admitting: Obstetrics & Gynecology

## 2019-08-16 ENCOUNTER — Other Ambulatory Visit: Payer: Self-pay

## 2019-08-16 ENCOUNTER — Encounter (HOSPITAL_BASED_OUTPATIENT_CLINIC_OR_DEPARTMENT_OTHER): Payer: Self-pay | Admitting: Obstetrics & Gynecology

## 2019-08-16 NOTE — Progress Notes (Signed)
Spoke w/ via phone for pre-op interview--- PT Lab needs dos---- Urine preg              Lab results------ getting CBC, BMP done 08-20-2019 @ 0900 COVID test ------ 08-20-2019 @ 0810 Arrive at ------- 1130 NPO after ------ MN w/ exception clear liquids until 0730 then nothing by mouth (no cream /milk products) Medications to take morning of surgery ----- Bentyl, Protonix, Nortrel w/ sips of water  Diabetic medication ----- n/a Patient Special Instructions ----- reviewed [facility] guidelines and visitor policy. Pt will not have home to bring since already taken them morning of surgery.  Also, pt stated told by Dr Juliene Pina if everything goes well she would not stay overnight Pre-Op special Istructions ----- n/a Patient verbalized understanding of instructions that were given at this phone interview. Patient denies shortness of breath, chest pain, fever, cough a this phone interview.

## 2019-08-20 ENCOUNTER — Encounter (HOSPITAL_COMMUNITY)
Admission: RE | Admit: 2019-08-20 | Discharge: 2019-08-20 | Disposition: A | Discharge status: Home / Self Care | Payer: No Typology Code available for payment source | Source: Ambulatory Visit | Attending: Obstetrics & Gynecology | Admitting: Obstetrics & Gynecology

## 2019-08-20 ENCOUNTER — Other Ambulatory Visit: Payer: Self-pay

## 2019-08-20 ENCOUNTER — Other Ambulatory Visit (HOSPITAL_COMMUNITY)
Admission: RE | Admit: 2019-08-20 | Discharge: 2019-08-20 | Disposition: A | Discharge status: Home / Self Care | Payer: No Typology Code available for payment source | Source: Ambulatory Visit | Attending: Obstetrics & Gynecology | Admitting: Obstetrics & Gynecology

## 2019-08-20 DIAGNOSIS — Z20822 Contact with and (suspected) exposure to covid-19: Secondary | ICD-10-CM | POA: Insufficient documentation

## 2019-08-20 DIAGNOSIS — Z01812 Encounter for preprocedural laboratory examination: Secondary | ICD-10-CM | POA: Insufficient documentation

## 2019-08-20 LAB — CBC
HCT: 41.5 % (ref 36.0–46.0)
Hemoglobin: 13.2 g/dL (ref 12.0–15.0)
MCH: 30 pg (ref 26.0–34.0)
MCHC: 31.8 g/dL (ref 30.0–36.0)
MCV: 94.3 fL (ref 80.0–100.0)
Platelets: 360 10*3/uL (ref 150–400)
RBC: 4.4 MIL/uL (ref 3.87–5.11)
RDW: 13.2 % (ref 11.5–15.5)
WBC: 7 10*3/uL (ref 4.0–10.5)
nRBC: 0 % (ref 0.0–0.2)

## 2019-08-20 LAB — BASIC METABOLIC PANEL
Anion gap: 9 (ref 5–15)
BUN: 14 mg/dL (ref 6–20)
CO2: 24 mmol/L (ref 22–32)
Calcium: 9.2 mg/dL (ref 8.9–10.3)
Chloride: 105 mmol/L (ref 98–111)
Creatinine, Ser: 0.8 mg/dL (ref 0.44–1.00)
GFR calc Af Amer: >60 mL/min (ref >60)
GFR calc non Af Amer: >60 mL/min (ref >60)
Glucose, Bld: 95 mg/dL (ref 70–99)
Potassium: 4.3 mmol/L (ref 3.5–5.1)
Sodium: 138 mmol/L (ref 135–145)

## 2019-08-20 LAB — SARS CORONAVIRUS 2 (TAT 6-24 HRS): SARS Coronavirus 2: NEGATIVE

## 2019-08-23 ENCOUNTER — Ambulatory Visit (HOSPITAL_BASED_OUTPATIENT_CLINIC_OR_DEPARTMENT_OTHER): Payer: No Typology Code available for payment source | Admitting: Certified Registered"

## 2019-08-23 ENCOUNTER — Encounter (HOSPITAL_BASED_OUTPATIENT_CLINIC_OR_DEPARTMENT_OTHER)
Admission: RE | Disposition: A | Discharge status: Home / Self Care | Payer: Self-pay | Source: Home / Self Care | Attending: Obstetrics & Gynecology

## 2019-08-23 ENCOUNTER — Ambulatory Visit (HOSPITAL_BASED_OUTPATIENT_CLINIC_OR_DEPARTMENT_OTHER)
Admission: RE | Admit: 2019-08-23 | Discharge: 2019-08-24 | Disposition: A | Discharge status: Home / Self Care | Payer: No Typology Code available for payment source | Attending: Obstetrics & Gynecology | Admitting: Obstetrics & Gynecology

## 2019-08-23 ENCOUNTER — Ambulatory Visit: Payer: No Typology Code available for payment source | Admitting: Family Medicine

## 2019-08-23 ENCOUNTER — Other Ambulatory Visit: Payer: Self-pay

## 2019-08-23 ENCOUNTER — Encounter (HOSPITAL_BASED_OUTPATIENT_CLINIC_OR_DEPARTMENT_OTHER): Payer: Self-pay | Admitting: Obstetrics & Gynecology

## 2019-08-23 DIAGNOSIS — N9972 Accidental puncture and laceration of a genitourinary system organ or structure during other procedure: Secondary | ICD-10-CM | POA: Diagnosis not present

## 2019-08-23 DIAGNOSIS — Z79899 Other long term (current) drug therapy: Secondary | ICD-10-CM | POA: Diagnosis not present

## 2019-08-23 DIAGNOSIS — N393 Stress incontinence (female) (male): Secondary | ICD-10-CM | POA: Diagnosis present

## 2019-08-23 DIAGNOSIS — K219 Gastro-esophageal reflux disease without esophagitis: Secondary | ICD-10-CM | POA: Insufficient documentation

## 2019-08-23 DIAGNOSIS — N816 Rectocele: Secondary | ICD-10-CM | POA: Diagnosis present

## 2019-08-23 DIAGNOSIS — K589 Irritable bowel syndrome without diarrhea: Secondary | ICD-10-CM | POA: Diagnosis not present

## 2019-08-23 DIAGNOSIS — N3641 Hypermobility of urethra: Secondary | ICD-10-CM | POA: Insufficient documentation

## 2019-08-23 HISTORY — DX: Irritable bowel syndrome, unspecified: K58.9

## 2019-08-23 HISTORY — DX: Stress incontinence (female) (male): N39.3

## 2019-08-23 HISTORY — DX: Rectocele: N81.6

## 2019-08-23 HISTORY — DX: Presence of spectacles and contact lenses: Z97.3

## 2019-08-23 HISTORY — PX: BLADDER SUSPENSION: SHX72

## 2019-08-23 HISTORY — DX: Gastro-esophageal reflux disease without esophagitis: K21.9

## 2019-08-23 HISTORY — PX: CYSTOSCOPY: SHX5120

## 2019-08-23 HISTORY — PX: RECTOCELE REPAIR: SHX761

## 2019-08-23 LAB — POCT PREGNANCY, URINE: Preg Test, Ur: NEGATIVE

## 2019-08-23 SURGERY — COLPORRHAPHY, POSTERIOR, FOR RECTOCELE REPAIR
Anesthesia: General

## 2019-08-23 MED ORDER — ACETAMINOPHEN 325 MG PO TABS
650.0000 mg | ORAL_TABLET | ORAL | Status: DC | PRN
  Administered 2019-08-23: 650 mg via ORAL

## 2019-08-23 MED ORDER — ONDANSETRON HCL 4 MG/2ML IJ SOLN
4.0000 mg | Freq: Four times a day (QID) | INTRAMUSCULAR | Status: DC | PRN
  Administered 2019-08-23: 4 mg via INTRAVENOUS

## 2019-08-23 MED ORDER — ONDANSETRON HCL 4 MG/2ML IJ SOLN
INTRAMUSCULAR | Status: AC
  Filled 2019-08-23: qty 2

## 2019-08-23 MED ORDER — ACETAMINOPHEN 325 MG PO TABS
ORAL_TABLET | ORAL | Status: AC
  Filled 2019-08-23: qty 2

## 2019-08-23 MED ORDER — FENTANYL CITRATE (PF) 100 MCG/2ML IJ SOLN
INTRAMUSCULAR | Status: AC
  Filled 2019-08-23: qty 2

## 2019-08-23 MED ORDER — HYDROCODONE-ACETAMINOPHEN 5-325 MG PO TABS
1.0000 | ORAL_TABLET | ORAL | Status: DC | PRN
  Administered 2019-08-24: 1 via ORAL

## 2019-08-23 MED ORDER — KETOROLAC TROMETHAMINE 30 MG/ML IJ SOLN: 30.0000 mg | Freq: Once | INTRAMUSCULAR | Status: DC

## 2019-08-23 MED ORDER — ONDANSETRON HCL 4 MG/2ML IJ SOLN
INTRAMUSCULAR | Status: DC | PRN
  Administered 2019-08-23: 4 mg via INTRAVENOUS

## 2019-08-23 MED ORDER — OXYCODONE HCL 5 MG PO TABS: 5.0000 mg | ORAL_TABLET | Freq: Four times a day (QID) | ORAL | 0 refills | Status: DC | PRN

## 2019-08-23 MED ORDER — HYDROMORPHONE HCL 1 MG/ML IJ SOLN
INTRAMUSCULAR | Status: AC
  Filled 2019-08-23: qty 1

## 2019-08-23 MED ORDER — LIDOCAINE 5 % EX OINT
1.0000 | TOPICAL_OINTMENT | CUTANEOUS | 0 refills | Status: DC | PRN
Start: 2019-08-23 — End: 2019-09-20

## 2019-08-23 MED ORDER — ONDANSETRON HCL 4 MG PO TABS: 4.0000 mg | ORAL_TABLET | Freq: Four times a day (QID) | ORAL | Status: DC | PRN

## 2019-08-23 MED ORDER — MIDAZOLAM HCL 5 MG/5ML IJ SOLN
INTRAMUSCULAR | Status: DC | PRN
  Administered 2019-08-23: 2 mg via INTRAVENOUS

## 2019-08-23 MED ORDER — LACTATED RINGERS IV SOLN: INTRAVENOUS | Status: DC

## 2019-08-23 MED ORDER — VASOPRESSIN 20 UNIT/ML IV SOLN
INTRAVENOUS | Status: DC | PRN
  Administered 2019-08-23: 50 mL via INTRAMUSCULAR

## 2019-08-23 MED ORDER — IBUPROFEN 800 MG PO TABS
800.0000 mg | ORAL_TABLET | Freq: Three times a day (TID) | ORAL | Status: DC
  Administered 2019-08-24: 800 mg via ORAL

## 2019-08-23 MED ORDER — HYDROMORPHONE HCL 1 MG/ML IJ SOLN
0.2500 mg | INTRAMUSCULAR | Status: DC | PRN
  Administered 2019-08-23 (×4): 0.5 mg via INTRAVENOUS

## 2019-08-23 MED ORDER — FENTANYL CITRATE (PF) 100 MCG/2ML IJ SOLN
INTRAMUSCULAR | Status: DC | PRN
  Administered 2019-08-23 (×2): 25 ug via INTRAVENOUS
  Administered 2019-08-23 (×2): 50 ug via INTRAVENOUS
  Administered 2019-08-23 (×2): 25 ug via INTRAVENOUS

## 2019-08-23 MED ORDER — PROPOFOL 10 MG/ML IV BOLUS
INTRAVENOUS | Status: AC
  Filled 2019-08-23: qty 20

## 2019-08-23 MED ORDER — IBUPROFEN 200 MG PO TABS: 600.0000 mg | ORAL_TABLET | Freq: Four times a day (QID) | ORAL | 0 refills | Status: DC | PRN

## 2019-08-23 MED ORDER — MIDAZOLAM HCL 2 MG/2ML IJ SOLN
INTRAMUSCULAR | Status: AC
  Filled 2019-08-23: qty 2

## 2019-08-23 MED ORDER — MENTHOL 3 MG MT LOZG: 1.0000 | LOZENGE | OROMUCOSAL | Status: DC | PRN

## 2019-08-23 MED ORDER — CEFAZOLIN SODIUM-DEXTROSE 2-4 GM/100ML-% IV SOLN
2.0000 g | INTRAVENOUS | Status: AC
  Administered 2019-08-23: 2 g via INTRAVENOUS

## 2019-08-23 MED ORDER — BUPIVACAINE HCL 0.25 % IJ SOLN
INTRAMUSCULAR | Status: DC | PRN
  Administered 2019-08-23: 20 mL

## 2019-08-23 MED ORDER — LIDOCAINE 2% (20 MG/ML) 5 ML SYRINGE
INTRAMUSCULAR | Status: DC | PRN
  Administered 2019-08-23: 100 mg via INTRAVENOUS

## 2019-08-23 MED ORDER — OXYCODONE-ACETAMINOPHEN 5-325 MG PO TABS: 1.0000 | ORAL_TABLET | Freq: Four times a day (QID) | ORAL | 0 refills | Status: DC | PRN

## 2019-08-23 MED ORDER — LIDOCAINE 2% (20 MG/ML) 5 ML SYRINGE
INTRAMUSCULAR | Status: AC
  Filled 2019-08-23: qty 5

## 2019-08-23 MED ORDER — DEXAMETHASONE SODIUM PHOSPHATE 10 MG/ML IJ SOLN
INTRAMUSCULAR | Status: AC
  Filled 2019-08-23: qty 1

## 2019-08-23 MED ORDER — DEXAMETHASONE SODIUM PHOSPHATE 10 MG/ML IJ SOLN
INTRAMUSCULAR | Status: DC | PRN
  Administered 2019-08-23: 10 mg via INTRAVENOUS

## 2019-08-23 MED ORDER — KETOROLAC TROMETHAMINE 30 MG/ML IJ SOLN
30.0000 mg | Freq: Once | INTRAMUSCULAR | Status: AC
  Administered 2019-08-23: 30 mg via INTRAVENOUS

## 2019-08-23 MED ORDER — CEFAZOLIN SODIUM-DEXTROSE 2-4 GM/100ML-% IV SOLN
INTRAVENOUS | Status: AC
  Filled 2019-08-23: qty 100

## 2019-08-23 MED ORDER — KETOROLAC TROMETHAMINE 30 MG/ML IJ SOLN
INTRAMUSCULAR | Status: AC
  Filled 2019-08-23: qty 1

## 2019-08-23 MED ORDER — PROPOFOL 10 MG/ML IV BOLUS
INTRAVENOUS | Status: DC | PRN
  Administered 2019-08-23: 180 mg via INTRAVENOUS

## 2019-08-23 SURGICAL SUPPLY — 39 items
ADH SKN CLS APL DERMABOND .7 (GAUZE/BANDAGES/DRESSINGS) ×1
BLADE SURG 15 STRL LF DISP TIS (BLADE) ×1 IMPLANT
BLADE SURG 15 STRL SS (BLADE) ×3
CANISTER SUCT 3000ML PPV (MISCELLANEOUS) ×3 IMPLANT
CATH FOLEY 2WAY SLVR  5CC 18FR (CATHETERS) ×3
CATH FOLEY 2WAY SLVR 5CC 18FR (CATHETERS) ×1 IMPLANT
CNTNR URN SCR LID CUP LEK RST (MISCELLANEOUS) IMPLANT
CONT SPEC 4OZ STRL OR WHT (MISCELLANEOUS)
COVER WAND RF STERILE (DRAPES) ×3 IMPLANT
DECANTER SPIKE VIAL GLASS SM (MISCELLANEOUS) IMPLANT
DERMABOND ADVANCED (GAUZE/BANDAGES/DRESSINGS) ×2
DERMABOND ADVANCED .7 DNX12 (GAUZE/BANDAGES/DRESSINGS) ×1 IMPLANT
DRAPE HYSTEROSCOPY (DRAPE) IMPLANT
DRAPE STERI URO 9X17 APER PCH (DRAPES) ×3 IMPLANT
GAUZE PACKING 1 X5 YD ST (GAUZE/BANDAGES/DRESSINGS) IMPLANT
GLOVE BIO SURGEON STRL SZ7 (GLOVE) ×3 IMPLANT
GLOVE BIOGEL PI IND STRL 7.0 (GLOVE) ×2 IMPLANT
GLOVE BIOGEL PI INDICATOR 7.0 (GLOVE) ×4
GOWN STRL REUS W/ TWL LRG LVL3 (GOWN DISPOSABLE) ×4 IMPLANT
GOWN STRL REUS W/TWL LRG LVL3 (GOWN DISPOSABLE) ×12
HIBICLENS CHG 4% 4OZ (MISCELLANEOUS) ×3 IMPLANT
NEEDLE HYPO 22GX1.5 SAFETY (NEEDLE) ×3 IMPLANT
NS IRRIG 1000ML POUR BTL (IV SOLUTION) ×3 IMPLANT
PACK VAGINAL WOMENS (CUSTOM PROCEDURE TRAY) ×6 IMPLANT
RETRACTOR LONRSTAR 16.6X16.6CM (MISCELLANEOUS) IMPLANT
RETRACTOR STAY HOOK 5MM (MISCELLANEOUS) IMPLANT
RETRACTOR STER APS 16.6X16.6CM (MISCELLANEOUS)
SET IRRIG Y TYPE TUR BLADDER L (SET/KITS/TRAYS/PACK) ×3 IMPLANT
SLING TVT EXACT (Sling) ×1 IMPLANT
SUT VIC AB 0 CT1 27 (SUTURE)
SUT VIC AB 0 CT1 27XBRD ANBCTR (SUTURE) IMPLANT
SUT VIC AB 2-0 SH 27 (SUTURE) ×3
SUT VIC AB 2-0 SH 27XBRD (SUTURE) ×1 IMPLANT
SUT VIC AB 3-0 SH 27 (SUTURE)
SUT VIC AB 3-0 SH 27X BRD (SUTURE) IMPLANT
TOWEL OR 17X26 10 PK STRL BLUE (TOWEL DISPOSABLE) ×6 IMPLANT
TRAY FOL W/BAG SLVR 16FR STRL (SET/KITS/TRAYS/PACK) ×1 IMPLANT
TRAY FOLEY W/BAG SLVR 14FR (SET/KITS/TRAYS/PACK) ×3 IMPLANT
TRAY FOLEY W/BAG SLVR 16FR LF (SET/KITS/TRAYS/PACK) ×3

## 2019-08-23 NOTE — Transfer of Care (Signed)
Immediate Anesthesia Transfer of Care Note  Patient: Kendra Payne  Procedure(s) Performed: POSTERIOR REPAIR (RECTOCELE)/Perineorrhaphy (N/A ) ATTEMPTED TWICE  TRANSVAGINAL TAPE (TVT) PROCEDURE (N/A ) CYSTOSCOPY (N/A )  Patient Location: PACU  Anesthesia Type:General  Level of Consciousness: awake, alert  and oriented  Airway & Oxygen Therapy: Patient Spontanous Breathing and Patient connected to nasal cannula oxygen  Post-op Assessment: Report given to RN and Post -op Vital signs reviewed and stable  Post vital signs: Reviewed and stable  Last Vitals:  Vitals Value Taken Time  BP 112/52 08/23/19 1545  Temp    Pulse 95 08/23/19 1547  Resp 12 08/23/19 1547  SpO2 100 % 08/23/19 1547  Vitals shown include unvalidated device data.  Last Pain:  Vitals:   08/23/19 1136  TempSrc: Oral         Complications: No complications documented.

## 2019-08-23 NOTE — Anesthesia Procedure Notes (Signed)
Procedure Name: LMA Insertion Date/Time: 08/23/2019 1:37 PM Performed by: Kenzee Bassin D, CRNA Pre-anesthesia Checklist: Patient identified, Emergency Drugs available, Suction available and Patient being monitored Patient Re-evaluated:Patient Re-evaluated prior to induction Oxygen Delivery Method: Circle system utilized Preoxygenation: Pre-oxygenation with 100% oxygen Induction Type: IV induction Ventilation: Mask ventilation without difficulty LMA: LMA inserted LMA Size: 4.0 Tube type: Oral Number of attempts: 1 Placement Confirmation: positive ETCO2 and breath sounds checked- equal and bilateral Tube secured with: Tape Dental Injury: Teeth and Oropharynx as per pre-operative assessment

## 2019-08-23 NOTE — OR Nursing (Signed)
Call to Dr. Juliene Pina to report color of pt urine output.  Darker than cherry color.  Pt vital signs stable pt states she is not uncomfortable.  Emptied 175 cc urine in the past 1 1/2 hour.  No furter orders received.  Margarita Mail rn

## 2019-08-23 NOTE — H&P (Signed)
Kendra Payne is an 44 y.o. female. G3P3, 3 SVDs. BC- OCs. Here for perineal bulge c/w rectocele and genuine stress urinary incontinence requesting surgery.  Nl Paps, nl menses. No breast issues, nl mammogram.  Patient's last menstrual period was 08/14/2019.    Past Medical History:  Diagnosis Date  . GERD (gastroesophageal reflux disease)   . IBS (irritable bowel syndrome)   . Rectocele   . SUI (stress urinary incontinence, female)   . Wears contact lenses     Past Surgical History:  Procedure Laterality Date  . FRACTURE SURGERY  08/26/1998   Left Femur  . KNEE ARTHROSCOPY Left 07-06-2004  @[facility]   . ORIF FEMUR FRACTURE  08/1999    Family History  Problem Relation Age of Onset  . Hypertension Mother   . Hypertension Father   . Cancer Maternal Grandmother   . Diabetes Maternal Grandmother   . Cancer Maternal Grandfather   . Diabetes Maternal Grandfather   . Breast cancer Maternal Aunt   . Colon cancer Neg Hx     Social History:  reports that she has never smoked. She has never used smokeless tobacco. She reports that she does not drink alcohol and does not use drugs.  Allergies: No Known Allergies  Medications Prior to Admission  Medication Sig Dispense Refill Last Dose  . dicyclomine (BENTYL) 10 MG capsule Take 1 capsule (10 mg total) by mouth 4 (four) times daily -  before meals and at bedtime. (Patient taking differently: Take 10 mg by mouth daily. ) 120 capsule 1 08/22/2019 at Unknown time  . NORTREL 7/7/7 0.5/0.75/1-35 MG-MCG tablet Take 1 tablet by mouth at bedtime.      . pantoprazole (PROTONIX) 40 MG tablet Take 1 tablet (40 mg total) by mouth daily before breakfast. (Patient taking differently: Take 40 mg by mouth daily before breakfast. ) 30 tablet 5 08/23/2019 at 0900  . phentermine 37.5 MG capsule Take 1 capsule (37.5 mg total) by mouth every morning. 30 capsule 0 08/16/2019 at Unknown time    Review of Systems neg  Blood pressure 123/89, pulse 75, temperature  97.7 F (36.5 C), temperature source Oral, resp. rate 20, height 5\' 7"  (1.702 m), weight 93.2 kg, last menstrual period 08/14/2019, SpO2 100 %. Physical Exam Physical exam:  A&O x 3, no acute distress. Pleasant HEENT neg, no thyromegaly Lungs CTA bilat CV RRR,S1S2 normal Abdo soft, non tender, non acute Extr no edema/ tenderness Pelvic Uterus, cervix normal. No cystocele. Grade 2 rectocele and wide gaping introitus. Urethral hyper mobility Office Urodynamics - No OAB. Only SUI, with nl pressure urethra  Results for orders placed or performed during the hospital encounter of 08/23/19 (from the past 24 hour(s))  Pregnancy, urine POC     Status: None   Collection Time: 08/23/19 11:29 AM  Result Value Ref Range   Preg Test, Ur NEGATIVE NEGATIVE    No results found.  Assessment/Plan: 44 yo G3P3. With rectocele and genuine SUI. Here for rectocele repair, perineorrhaphy and TVT sling and cystoscopy.  Risks/complications of surgery reviewed incl infection, bleeding, damage to internal organs including bladder, bowels, ureters, blood vessels, other risks from anesthesia, VTE and delayed complications of any surgery, complications in future surgery reviewed.  Discussed recurrence of prolapse, weight management, post-op pelvic exercise. Discussed mesh complications in the long or short term incl mesh erosion from TVT. Discussed option of expectant mngmt, pelvic PT and declined. She voiced understanding and consents to proceed with surgery.   08/25/19 08/23/2019,  12:56 PM

## 2019-08-23 NOTE — Anesthesia Postprocedure Evaluation (Signed)
Anesthesia Post Note  Patient: Kendra Payne  Procedure(s) Performed: POSTERIOR REPAIR (RECTOCELE)/Perineorrhaphy (N/A ) ATTEMPTED TWICE  TRANSVAGINAL TAPE (TVT) PROCEDURE (N/A ) CYSTOSCOPY (N/A )     Patient location during evaluation: PACU Anesthesia Type: General Level of consciousness: awake and alert Pain management: pain level controlled Vital Signs Assessment: post-procedure vital signs reviewed and stable Respiratory status: spontaneous breathing, nonlabored ventilation, respiratory function stable and patient connected to nasal cannula oxygen Cardiovascular status: blood pressure returned to baseline and stable Postop Assessment: no apparent nausea or vomiting Anesthetic complications: no   No complications documented.  Last Vitals:  Vitals:   08/23/19 1610 08/23/19 1615  BP:    Pulse: 80 79  Resp: 18 16  Temp:    SpO2: 100% 100%    Last Pain:  Vitals:   08/23/19 1600  TempSrc: Oral                 Ann-Marie Kluge

## 2019-08-23 NOTE — Anesthesia Preprocedure Evaluation (Signed)
Anesthesia Evaluation  Patient identified by MRN, date of birth, ID band Patient awake    Reviewed: Allergy & Precautions, NPO status , Patient's Chart, lab work & pertinent test results  Airway Mallampati: II  TM Distance: >3 FB     Dental   Pulmonary    breath sounds clear to auscultation       Cardiovascular negative cardio ROS   Rhythm:Regular Rate:Normal     Neuro/Psych    GI/Hepatic Neg liver ROS, GERD  ,  Endo/Other  negative endocrine ROS  Renal/GU      Musculoskeletal   Abdominal   Peds  Hematology   Anesthesia Other Findings   Reproductive/Obstetrics                             Anesthesia Physical Anesthesia Plan  ASA: II  Anesthesia Plan: General   Post-op Pain Management:    Induction: Intravenous  PONV Risk Score and Plan: 3 and Ondansetron, Dexamethasone and Midazolam  Airway Management Planned: LMA  Additional Equipment:   Intra-op Plan:   Post-operative Plan:   Informed Consent: I have reviewed the patients History and Physical, chart, labs and discussed the procedure including the risks, benefits and alternatives for the proposed anesthesia with the patient or authorized representative who has indicated his/her understanding and acceptance.     Dental advisory given  Plan Discussed with: CRNA and Anesthesiologist  Anesthesia Plan Comments:         Anesthesia Quick Evaluation

## 2019-08-23 NOTE — Op Note (Signed)
Preoperative diagnosis: Rectocele, Perineal relaxation. Stress urinary incontinence, urethral hypermobility  Postoperative diagnosis: Same Procedure: Rectocele repair, Posterior Perineorrhaphy, Failed TVT sling placement x2, Cystoscopy   Surgeon: Shea Evans, MD Assistant: none Anesthesia: General, via LMA Antibiotics 2 g Ancef Complications: Unplanned bladder perforation with TVT needle Estimated blood loss: 200 cc Findings: Hypermobile urethra, grade 2 Rectocele and gaping perineum.   Indications: Patient has been counseled on the risk and benefits of this procedure including the risk of urinary retention (and possibly needing to go home with a catheter), risk of bladder or abdominal injury, risk of sling erosion, immediate surgical and anaesthesia risks,  and possibly worsening or causing de-novo urge incontinence. Consented to TVT sling and cystoscopy and rectocele repair and perineorrhaphy.   Procedure: After informed consent was obtained from the patient she was taken to the operating room where general anesthesia was initiated without difficulty. Times out was carried out. First part was TVT sling, cystoscopy followed by rectocele repair and perineorrhaphy.  Mons pubis was shaved, the pubic symphysis was marked for the planned bilateral exit points, 2 cm lateral to the midline just above the a pubic symphysis on both sides.  A solution of 20 units Pitressin in 50 cc of normal saline was used as the injection. 20 cc of this solution was injected into the suprapubic space bilaterally with spinal needle from the planned abdominal exit points behind the pubic symphysis and into the retropubic space. This space was confirmed with the use of the hand in the vagina to feel the needle tip prior to aspirating and injecting. A Foley catheter was then placed to drain the bladder the balloon was inflated and used to help identify the bladder and to plan the sling placement in the mid urethral position.  Two Allis clamps placed 2 cm below the urethral meatus (in the area of mid urethra) on either side of the midline. 10 cc of the injecting solution injected between the 2 Allis clamps with lateral spread in the area of vaginal tunnels. A #15 blade was used to incise between the Allis clamps and with Metzenbaum scissors vaginal mucosa dissected to creat tunnels toward retropubic space under the pubic ramus in the direction of ipsilateral shoulder.  Once the planned entry routes were dissected a rigid Foley guide was placed through the foley into the bladder. Bladder neck was deviated to the left by moving the guide to patient's right thigh and right retropubic space was targeted. TVT sling plastic sleeve was mounted on blunt 82mm trocar and was inserted into the pre-dissected space and was pushed under the pubic bone, and while hugging the bone and dropping the trocar handle towards the ground it was directed to the abdominal wall, bringing it out through the premarked right exit incision. A Kelly clamp was placed on the plastic sleeve tip and the trocar was removed through the vaginal incision. The trocar was placed in the opposite TVT sling sleeve. Bladder neck was deviated to right by moving the foley with guide to the left thigh and opening up left retropubic space and the trocar was inserted into the left pre-dissected space and was pushed under the pubic bone, and while hugging the bone and dropping the trocar handle towards the ground it was directed to the abdominal wall, bringing it out through the premarked left exit incision. Kelly clamp placed on plastic sleeve and metal trocar removed.   Cystoscopy was performed with saline for irrigation. Bladder was filled to 300 cc and evaluation was  done of the entire bladder mucosa and the urethra. Right TVT needle with plastic sleeve was seen entering and exiting right later bladder wall. Left placement was uneventful.  So bladder was emptied, TVT needle with  sheath removed. Now needle placement was reattempted in similar fashion and Cystoscopy performed. This time it was difficult to visualize bladder due to bleeding. After thorough irrigation, I was able to see right TV needle very close to bladder mucosa, not perforating any more but too close to deploy. So decision was made to not make any further attempts at reinserting right TVT needle. Both TVT needles were removed after bladder was emptied. #16 foley placed back to complete posterior perineorrhaphy and rectocele repair.   0.25% Marcaine injected in her perineum after placement to two Allis clamps right and left side of posterior fourchette. Inverted triangle incision made with #15 blade and excess skin excised. Now Allis clamp placed at the highest post of rectocele and tented up and further infiltration with dilute vasopressin done to creat a plane by hydro dissection. With Metzenbaum vaginal mucosa was undermined and incised vertically up to the apex. Both cut edges grasped with Allis clamps and rectovaginal septum space dissected aware from vaginal mucosa. Excess vaginal mucosa excised. Purse string suture with 2-0 Vicryl placed to approximate rectovaginal septum to reduce rectocele. Rectal exam was performed and was normal with nu tear or suture felt rectally. Now vaginal mucosa approximated to complete the repair up to perineal body. A separate suture of 2-0 vicryl in placed to approximate perineal body and then skin approximated with 3-0 Vicryl to complete perineorrhaphy as we ended. The vaginal mucosa incision of TVT attempt was sutured with 3-0 vicryl. Good hemostasis was noted.   Cystoscopy was performed again. Slight bloody fluid noted but nothing active. Some fluid left in bladder to allow voiding trial in PACU.  If patient is not able to void well, will discharge home with catheter and remove in office on 6/22.  All counts were correct x2 and the patient was taken to the recovery room in stable  condition. Findings discussed with patient's husband and her.   V.Adrien Dietzman, MD

## 2019-08-24 DIAGNOSIS — N816 Rectocele: Secondary | ICD-10-CM | POA: Diagnosis not present

## 2019-08-24 DIAGNOSIS — N9972 Accidental puncture and laceration of a genitourinary system organ or structure during other procedure: Secondary | ICD-10-CM | POA: Diagnosis not present

## 2019-08-24 HISTORY — DX: Accidental puncture and laceration of a genitourinary system organ or structure during other procedure: N99.72

## 2019-08-24 MED ORDER — HYDROCODONE-ACETAMINOPHEN 5-325 MG PO TABS
ORAL_TABLET | ORAL | Status: AC
  Filled 2019-08-24: qty 1

## 2019-08-24 MED ORDER — IBUPROFEN 800 MG PO TABS
ORAL_TABLET | ORAL | Status: AC
  Filled 2019-08-24: qty 1

## 2019-08-24 MED ORDER — SODIUM CHLORIDE (PF) 0.9 % IJ SOLN: 20.0000 mL | Freq: Once | INTRAMUSCULAR | Status: DC

## 2019-08-24 MED ORDER — ACETAMINOPHEN 325 MG PO TABS: 650.0000 mg | ORAL_TABLET | ORAL | Status: DC | PRN

## 2019-08-24 MED ORDER — CEPHALEXIN 500 MG PO CAPS: 500.0000 mg | ORAL_CAPSULE | Freq: Every day | ORAL | 0 refills | Status: AC

## 2019-08-24 MED ORDER — CEPHALEXIN 500 MG PO CAPS: 500.0000 mg | ORAL_CAPSULE | Freq: Every day | ORAL | Status: DC

## 2019-08-24 NOTE — Progress Notes (Signed)
Pt up to BR voided but missed hat.  Back to bed bladder scanned her 0 cc in bladder after voiding and pt feels like her bladder is empty.

## 2019-08-24 NOTE — Progress Notes (Signed)
Pt c/o pressure feelslike she needs to void, foley catheter not draining.  Dr. Juliene Pina called orders rec to take out foley and have pt void.

## 2019-08-24 NOTE — Progress Notes (Signed)
Pt c/o pressure needing to urinate, foley cath not draining any urine, used several different positions changes and no urine in foley. Bag. Pt back to bed bladder scanner used on pt reading 378 cc of urine in bladder.  Dr. Juliene Pina called notfied foley cath not draining and bladder scanner reading 378cc in bladder order rec to flush foley catheter once if that doesn't work put in 3 way catheter.Marland Kitchen

## 2019-08-24 NOTE — Discharge Summary (Signed)
Physician Discharge Summary  Patient ID: Kendra Payne MRN: [id number] DOB/AGE: 06/16/1975 44 y.o.  Admit date: 08/23/2019 Discharge date: 08/24/2019  Admission Diagnoses: Stress urinary incontinence, Rectocele.  Discharge Diagnoses: Same. S/p attempted TVT x2 with bladder perforation, cystoscopy.                                          S/p Rectocele repair and perioneorrhaphy   Discharged Condition: good. Voided well with no residual urine on bladder scanner after removing foley on POD#1  Hospital Course: Patient was admitted overnight due to vaginal bleeding and incidental bladder perforation x 2 on right lateral superior bladder wall with TVT needle. Foley was left overnight due to hematuria. In the morning of post-op day 1, it got blocked twice with tiny blood clot and needed to be flushed. Urine was pretty much clear by this time. So foley was removed and pt voided well, no hematuria noted and bladder scanning noted minimal residual urine. No vaginal bleeding noted in the morning of discharge.   Discharge Exam: Blood pressure 117/78, pulse 73, temperature 98.1 F (36.7 C), resp. rate 16, height 5\' 7"  (1.702 m), weight 93.2 kg, last menstrual period 08/14/2019, SpO2 100 %.   Disposition: Discharge disposition: 01-Home or Self Care       Discharge Instructions    Call MD for:   Complete by: As directed    Unable to urinate every 4-6 hrs   Call MD for:   Complete by: As directed    Make sure to urinate every 4 hrs at least and sooner as needed. Unable to urinate after waiting 4 hrs. Urine starting to get very bloody again. Heavy vaginal bleeding.   Call MD for:  difficulty breathing, headache or visual disturbances   Complete by: As directed    Call MD for:  difficulty breathing, headache or visual disturbances   Complete by: As directed    Call MD for:  hives   Complete by: As directed    Call MD for:  persistant nausea and vomiting   Complete by: As directed    Call  MD for:  persistant nausea and vomiting   Complete by: As directed    Call MD for:  redness, tenderness, or signs of infection (pain, swelling, redness, odor or green/yellow discharge around incision site)   Complete by: As directed    Call MD for:  redness, tenderness, or signs of infection (pain, swelling, redness, odor or green/yellow discharge around incision site)   Complete by: As directed    Call MD for:  severe uncontrolled pain   Complete by: As directed    Call MD for:  severe uncontrolled pain   Complete by: As directed    Call MD for:  temperature >100.4   Complete by: As directed    Call MD for:  temperature >100.4   Complete by: As directed    Diet - low sodium heart healthy   Complete by: As directed    Diet - low sodium heart healthy   Complete by: As directed    Increase activity slowly   Complete by: As directed    Increase activity slowly   Complete by: As directed    Increase activity slowly   Complete by: As directed    Lifting restrictions   Complete by: As directed    20 pounds only up to 6  weeks   Lifting restrictions   Complete by: As directed    No more than 25 lbs for 4-6 wks   No dressing needed   Complete by: As directed    No dressing needed   Complete by: As directed    No dressing needed   Complete by: As directed    Sexual Activity Restrictions   Complete by: As directed    6 weeks   Sexual Activity Restrictions   Complete by: As directed    6 weeks     Allergies as of 08/24/2019   No Known Allergies     Medication List    TAKE these medications   acetaminophen 325 MG tablet Commonly known as: TYLENOL Take 2 tablets (650 mg total) by mouth every 4 (four) hours as needed for mild pain (temperature > 101.5.).   cephALEXin 500 MG capsule Commonly known as: KEFLEX Take 1 capsule (500 mg total) by mouth daily for 3 days.   dicyclomine 10 MG capsule Commonly known as: BENTYL Take 1 capsule (10 mg total) by mouth 4 (four) times  daily -  before meals and at bedtime. What changed: when to take this   ibuprofen 200 MG tablet Commonly known as: Advil Take 3 tablets (600 mg total) by mouth every 6 (six) hours as needed. Notes to patient: Next dose is due at 1030 am for pain   lidocaine 5 % ointment Commonly known as: XYLOCAINE Apply 1 application topically as needed. On perineum repair every 6-8 hrs as needed   Nortrel 7/7/7 0.5/0.75/1-35 MG-MCG tablet Generic drug: norethindrone-ethinyl estradiol Take 1 tablet by mouth at bedtime.   oxyCODONE-acetaminophen 5-325 MG tablet Commonly known as: Percocet Take 1 tablet by mouth every 6 (six) hours as needed for severe pain. Notes to patient: Next dose is due at 11 am as needed for pain   pantoprazole 40 MG tablet Commonly known as: PROTONIX Take 1 tablet (40 mg total) by mouth daily before breakfast.   phentermine 37.5 MG capsule Take 1 capsule (37.5 mg total) by mouth every morning.            Discharge Care Instructions  (From admission, onward)         Start     Ordered   08/24/19 0000  No dressing needed        08/24/19 0946   08/23/19 0000  No dressing needed        08/23/19 1551   08/23/19 0000  No dressing needed        08/23/19 1555          Follow-up Information    Azucena Fallen, MD Follow up in 2 week(s).   Specialty: Obstetrics and Gynecology Contact information: [address] [place] [address] [phone]               Signed: Elveria Royals 08/24/2019, 9:56 AM

## 2019-08-24 NOTE — Discharge Instructions (Signed)
Anterior and Posterior Colporrhaphy and Sling Procedure, Care After This sheet gives you information about how to care for yourself after your procedure. Your health care provider may also give you more specific instructions. If you have problems or questions, contact your health care provider. What can I expect after the procedure? After the procedure, it is common to have:  Pain in the surgical area.  Vaginal discharge. You will need to use a sanitary pad during this time.  Fatigue. Follow these instructions at home: Incision care   Follow instructions from your health care provider about how to take care of your incision. Make sure you: ? Wash your hands with soap and water before touching the incision area. If soap and water are not available, use hand sanitizer. ? Clean your incision as told by your health care provider. ? Leave stitches (sutures), skin glue, or adhesive strips in place. These skin closures may need to stay in place for 2 weeks or longer. If adhesive strip edges start to loosen and curl up, you may trim the loose edges. Do not remove adhesive strips completely unless your health care provider tells you to do that.  Check your incision area every day for signs of infection. Check for: ? Redness, swelling, or pain. ? Fluid or blood. ? Warmth. ? Pus or a bad smell.  Check your incision every day to make sure the incision area is not separating or opening.  Do not take baths, swim, or use a hot tub until your health care provider approves. You may shower.  Keep the area between your vagina and rectum (perineal area) clean and dry. Make sure you clean the area after each bowel movement and each time you urinate.  Ask your health care provider if you can take a sitz bath or sit in a tub of clean, warm water. Activity  Do gentle, daily activity as told by your health care provider. You may be told to take short walks every day and go farther each time. Ask your health  care provider what activities are safe for you.  Limit stair climbing to once or twice a day in the first week, then slowly increase this activity.  Do not lift anything that is heavier than 10 lbs. (4.5 kg), or the limit that your health care provider tells you, until he or she says that it is safe. Avoid pushing or pulling motions.  Avoid standing for long periods of time.  Do not douche, use tampons, or have sex until your health care provider says it is okay.  Do not drive or use heavy machinery while taking prescription pain medicine. To prevent constipation  To prevent or treat constipation while you are taking prescription pain medicine, your health care provider may recommend that you: ? Take over-the-counter or prescription medicines. ? Eat foods that are high in fiber, such as fresh fruits and vegetables, whole grains, and beans. ? Drink enough fluid to keep your urine clear or pale yellow. ? Limit foods that are high in fat and processed sugars, such as fried and sweet foods. General instructions  You may be instructed to do pelvic floor exercises (kegels) as told by your health care provider.  Take over-the-counter and prescription medicines only as told by your health care provider.  Keep all follow-up visits as told by your health care provider. This is important. Contact a health care provider if:  Medicine does not help your pain.  You have frequent or urgent urination, or  you are unable to completely empty your bladder.  You feel a burning sensation when urinating.  You have fluid or blood coming from your incision.  You have pus or a bad smell coming from the incision.  Your incision feels warm to the touch.  You have redness, swelling, or pain around your incision. Get help right away if:  You have a fever or chills.  Your incision separates or opens.  You cannot urinate.  You have trouble breathing. Summary  After the procedure, it is common to  have pain, fatigue, and discharge from the vagina.  Keep the area between your vagina and rectum (perineal area) clean and dry. Make sure you clean the area after each bowel movement and each time you urinate.  Follow instructions from your health care provider on any activity restrictions after the procedure. This information is not intended to replace advice given to you by your health care provider. Make sure you discuss any questions you have with your health care provider. Document Revised: 02/03/2017 Document Reviewed: 02/22/2016 Elsevier Patient Education  2020 [employer] Void every 4-5 hours to prevent over distension of bladder

## 2019-08-24 NOTE — Progress Notes (Signed)
Foley catheter flushed with NS and started draining pink color urine. Pt tolerated procedure well.

## 2019-08-24 NOTE — Progress Notes (Signed)
Subjective: Patient reports feeling well. Nausea last night but ate normal diet and felt good. Pain well controlled, needed one Percocet. Voiding well after catheter was removed this morning with bladder scan noting no residual volume and pt have no urgency.  Vag bleeding is minimal. Has some perineal discomfort. .    Objective: I have reviewed patient's vital signs, intake and output, medications and labs.  General: alert and cooperative Resp: clear to auscultation bilaterally Cardio: regular rate and rhythm, S1, S2 normal, no murmur, click, rub or gallop GI: soft, non-tender; bowel sounds normal; no masses,  no organomegaly Extremities: extremities normal, atraumatic, no cyanosis or edema Vaginal Bleeding: minimal Urine noted in hat, clear with 1 cm clot in it.   Assessment/Plan: POstop day 1. S/p Rectocele repair and perineorrhaphy, also attempted TVT x2 with bladder perforation on right lateral wall, so procedure was not completed.  Patient needed to stay overnight for gross hematuria and vaginal bleeding and indwelling foley kept overnight. Got clotted this morning twice, flushed and emptied. Urine was almost clear, so considering the bladder punctures (2) are <3 mm with TVT Exact, decision was made to allow pt to have voiding trial and she passed. Her voided urine was more clear than what was in foley bag.   So discharge without foley catheter, needs to void every 3-4 hrs, hydrate well, Rx Keflex 500mg  po daily 3 days prophylaxis due to multiple caths. Will call back if hematuria returns or has retention over 4 hrs.  Post op care for rectocele/ perineorrhaphy with lifting restrictions, pelvic rest etc and warning signs reviewed.  F/up with me in 2 weeks and sooner as needed. Call office line after-hrs for emergency as needed   08/24/2019, 9:48 AM

## 2019-08-26 ENCOUNTER — Encounter (HOSPITAL_BASED_OUTPATIENT_CLINIC_OR_DEPARTMENT_OTHER): Payer: Self-pay | Admitting: Obstetrics & Gynecology

## 2019-08-27 MED FILL — PANTOPRAZOLE SOD DR 40 MG T: 40 | 30 days supply | Qty: 30 | Fill #5

## 2019-09-06 ENCOUNTER — Ambulatory Visit: Payer: No Typology Code available for payment source | Admitting: Family Medicine

## 2019-09-13 ENCOUNTER — Ambulatory Visit: Payer: No Typology Code available for payment source | Admitting: Family Medicine

## 2019-09-13 ENCOUNTER — Other Ambulatory Visit: Payer: Self-pay

## 2019-09-20 ENCOUNTER — Other Ambulatory Visit: Payer: Self-pay

## 2019-09-20 ENCOUNTER — Encounter: Payer: Self-pay | Admitting: Family Medicine

## 2019-09-20 ENCOUNTER — Ambulatory Visit (INDEPENDENT_AMBULATORY_CARE_PROVIDER_SITE_OTHER): Payer: No Typology Code available for payment source | Admitting: Family Medicine

## 2019-09-20 DIAGNOSIS — E669 Obesity, unspecified: Secondary | ICD-10-CM | POA: Diagnosis not present

## 2019-09-20 MED ORDER — PHENTERMINE HCL 37.5 MG PO CAPS: 37.5000 mg | ORAL_CAPSULE | ORAL | 1 refills | Status: DC

## 2019-09-20 MED FILL — PHENTERMINE HCL 37.5 MG CAP: 37.5 | 30 days supply | Qty: 30 | Fill #0

## 2019-09-20 NOTE — Progress Notes (Signed)
Assessment & Plan:  1. Obesity (BMI 30.0-34.9) - Patient is going to stop the phentermine until she gets the okay to start exercising again so that the phentermine can be combined with diet and exercise and not used alone.  I have given her 2 months of medication and advised that she schedule a follow-up 2 months after she resumes the phentermine, before she runs out. - phentermine 37.5 MG capsule; Take 1 capsule (37.5 mg total) by mouth every morning.  Dispense: 30 capsule; Refill: 1   Return 2 months from when you start back on Phentermine (before you run out of medication), for weight.  Kendra Boston, Kendra Payne, Kendra Payne, Kendra Payne [facility]  Subjective:    Patient ID: Kendra Payne, female    DOB: 02/15/1976, 44 y.o.   MRN: [id number]  Patient Care Team: Gwenlyn Fudge, FNP as PCP - General (Family Medicine) Jena Gauss Gerrit Friends, MD as Consulting Physician (Gastroenterology)   Chief Complaint:  Chief Complaint  Patient presents with  . Weight Check    4 week    HPI: Kendra Payne is a 44 y.o. female presenting on 09/20/2019 for Weight Check (4 week)  Patient was started on phentermine on 07/02/2019 at which time she weighed 217 lbs.  She had got down to 204 lbs early June.  She had to have surgery in the middle of June and went off of her phentermine x2 weeks while she was taking opioid for pain management.  She started back on the phentermine on 09/09/2019.  She has been unable to exercise since her surgery.  She does have a follow-up on 10/04/2019 at which time she is hoping to get permission to start exercising again.  New complaints: None  Social history:  Relevant past medical, surgical, family and social history reviewed and updated as indicated. Interim medical history since our last visit reviewed.  Allergies and medications reviewed and updated.  DATA REVIEWED: CHART IN EPIC  ROS: Negative unless specifically indicated above in HPI.    Current  Outpatient Medications:  .  acetaminophen (TYLENOL) 325 MG tablet, Take 2 tablets (650 mg total) by mouth every 4 (four) hours as needed for mild pain (temperature > 101.5.)., Disp: , Rfl:  .  dicyclomine (BENTYL) 10 MG capsule, Take 1 capsule (10 mg total) by mouth 4 (four) times daily -  before meals and at bedtime. (Patient taking differently: Take 10 mg by mouth daily. ), Disp: 120 capsule, Rfl: 1 .  ibuprofen (ADVIL) 200 MG tablet, Take 3 tablets (600 mg total) by mouth every 6 (six) hours as needed., Disp: 30 tablet, Rfl: 0 .  NORTREL 7/7/7 0.5/0.75/1-35 MG-MCG tablet, Take 1 tablet by mouth at bedtime. , Disp: , Rfl:  .  pantoprazole (PROTONIX) 40 MG tablet, Take 1 tablet (40 mg total) by mouth daily before breakfast. (Patient taking differently: Take 40 mg by mouth daily before breakfast. ), Disp: 30 tablet, Rfl: 5 .  phentermine 37.5 MG capsule, Take 1 capsule (37.5 mg total) by mouth every morning., Disp: 30 capsule, Rfl: 0   No Known Allergies Past Medical History:  Diagnosis Date  . GERD (gastroesophageal reflux disease)   . IBS (irritable bowel syndrome)   . Rectocele   . SUI (stress urinary incontinence, female)   . Wears contact lenses     Past Surgical History:  Procedure Laterality Date  . BLADDER SUSPENSION N/A 08/23/2019   Procedure: ATTEMPTED TWICE  TRANSVAGINAL TAPE (TVT) PROCEDURE;  Surgeon: Shea Evans,  MD;  Location: [facility];  Service: Gynecology;  Laterality: N/A;  . CYSTOSCOPY N/A 08/23/2019   Procedure: CYSTOSCOPY;  Surgeon: Shea Evans, MD;  Location: [facility];  Service: Gynecology;  Laterality: N/A;  . FRACTURE SURGERY  08/26/1998   Left Femur  . KNEE ARTHROSCOPY [facility]  [facility]   . ORIF FEMUR FRACTURE  08/1999  . RECTOCELE REPAIR N/A 08/23/2019   Procedure: POSTERIOR REPAIR (RECTOCELE)/Perineorrhaphy;  Surgeon: 08/25/2019, MD;  Location: [facility];  Service: Gynecology;  Laterality: N/A;   Requests 2 hrs.    Social History   Socioeconomic History  . Marital status: Married    Spouse name: [facility]   . Number of children: 3  . Years of education: Not on file  . Highest education level: Associate degree: academic program  Occupational History  . 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 Smoking status: Never Smoker  . Smokeless tobacco: Never Used  Vaping Use  . Vaping Use: Never used  Substance and Sexual Activity  . Alcohol use: No  . Drug use: No  . Sexual activity: Yes    Birth control/protection: Pill  Other Topics Concern  . Not on file  Social History Narrative  . Not on file   Social Determinants of Health   Financial Resource Strain:   . Difficulty of Paying Living Expenses:   Food Insecurity:   . Worried About Estate manager/land agent in the Last Year:   . Programme researcher, broadcasting/film/video in the Last Year:   Transportation Needs:   . Barista (Medical):   Freight forwarder Lack of Transportation (Non-Medical):   Physical Activity:   . Days of Exercise per Week:   . Minutes of Exercise per Session:   Stress:   . Feeling of Stress :   Social Connections:   . Frequency of Communication with Friends and Family:   . Frequency of Social Gatherings with Friends and Family:   . Attends Religious Services:   . Active Member of Clubs or Organizations:   . Attends Marland Kitchen Meetings:   Banker Marital Status:   Intimate Partner Violence:   . Fear of Current or Ex-Partner:   . Emotionally Abused:   Marland Kitchen Physically Abused:   . Sexually Abused:         Objective:    BP 110/79   Pulse 81   Temp 97.7 F (36.5 C) (Temporal)   Ht 5\' 7"  (1.702 m)   Wt 204 lb 3.2 oz (92.6 kg)   SpO2 100%   BMI 31.98 kg/m   Wt Readings from Last 3 Encounters:  09/20/19 204 lb 3.2 oz (92.6 kg)  08/23/19 205 lb 6.4 oz (93.2 kg)  08/20/19 205 lb (93 kg)    Physical Exam Vitals reviewed.  Constitutional:      General:  She is not in acute distress.    Appearance: Normal appearance. She is obese. She is not ill-appearing, toxic-appearing or diaphoretic.  HENT:     Head: Normocephalic and atraumatic.  Eyes:     General: No scleral icterus.       Right eye: No discharge.        Left eye: No discharge.     Conjunctiva/sclera: Conjunctivae normal.  Cardiovascular:     Rate and Rhythm: Normal rate and regular rhythm.     Heart sounds: Normal heart sounds. No murmur heard.  No friction rub. No gallop.   Pulmonary:     Effort: Pulmonary effort is normal. No respiratory distress.     Breath sounds: Normal breath sounds. No stridor. No wheezing, rhonchi or rales.  Musculoskeletal:        General: Normal range of motion.     Cervical back: Normal range of motion.  Skin:    General: Skin is warm and dry.     Capillary Refill: Capillary refill takes less than 2 seconds.  Neurological:     General: No focal deficit present.     Mental Status: She is alert and oriented to person, place, and time. Mental status is at baseline.  Psychiatric:        Mood and Affect: Mood normal.        Behavior: Behavior normal.        Thought Content: Thought content normal.        Judgment: Judgment normal.     Lab Results  Component Value Date   TSH 1.450 01/12/2017   Lab Results  Component Value Date   WBC 7.0 08/20/2019   HGB 13.2 08/20/2019   HCT 41.5 08/20/2019   MCV 94.3 08/20/2019   PLT 360 08/20/2019   Lab Results  Component Value Date   NA 138 08/20/2019   K 4.3 08/20/2019   CO2 24 08/20/2019   GLUCOSE 95 08/20/2019   BUN 14 08/20/2019   CREATININE 0.80 08/20/2019   BILITOT 0.3 05/29/2019   ALKPHOS 62 05/29/2019   AST 13 05/29/2019   ALT 13 05/29/2019   PROT 7.2 05/29/2019   ALBUMIN 4.2 05/29/2019   CALCIUM 9.2 08/20/2019   ANIONGAP 9 08/20/2019   Lab Results  Component Value Date   CHOL 156 05/29/2019   Lab Results  Component Value Date   HDL 42 05/29/2019   Lab Results  Component  Value Date   LDLCALC 96 05/29/2019   Lab Results  Component Value Date   TRIG 96 05/29/2019   Lab Results  Component Value Date   CHOLHDL 3.7 05/29/2019   No results found for: HGBA1C

## 2019-09-30 ENCOUNTER — Other Ambulatory Visit: Payer: Self-pay | Admitting: Gastroenterology

## 2019-09-30 DIAGNOSIS — K219 Gastro-esophageal reflux disease without esophagitis: Secondary | ICD-10-CM

## 2019-10-01 ENCOUNTER — Other Ambulatory Visit: Payer: Self-pay | Admitting: Gastroenterology

## 2019-10-01 MED FILL — PANTOPRAZOLE SOD DR 40 MG T: 40 | 90 days supply | Qty: 90 | Fill #0

## 2019-10-03 MED FILL — DASETTA 7/7/7-28 TABLET: 0.5/0.75/1- | 84 days supply | Qty: 84 | Fill #2

## 2019-11-21 ENCOUNTER — Encounter: Payer: Self-pay | Admitting: Family Medicine

## 2019-11-21 ENCOUNTER — Ambulatory Visit (INDEPENDENT_AMBULATORY_CARE_PROVIDER_SITE_OTHER): Payer: No Typology Code available for payment source | Admitting: Family Medicine

## 2019-11-21 DIAGNOSIS — E669 Obesity, unspecified: Secondary | ICD-10-CM | POA: Diagnosis not present

## 2019-11-21 NOTE — Progress Notes (Signed)
Virtual Visit via Telephone Note  I connected with Kendra Payne on 11/21/19 at 10:09 AM by telephone and verified that I am speaking with the correct person using two identifiers. Kendra Payne is currently located at work and nobody is currently with her during this visit. The provider, Gwenlyn Fudge, FNP is located in their office at time of visit.  I discussed the limitations, risks, security and privacy concerns of performing an evaluation and management service by telephone and the availability of in person appointments. I also discussed with the patient that there may be a patient responsible charge related to this service. The patient expressed understanding and agreed to proceed.  Subjective: PCP: Gwenlyn Fudge, FNP  Chief Complaint  Patient presents with  . Weight Check   Patient is following up on weight loss with phentermine.  She has been without phentermine for the past week as she did not realize there was a refill on it at the pharmacy.  She is still trying to eat right.  She started exercise back a month ago, postop.  She has not yet got back to 4 days a week as she has been trying to work herself back into it slowly since the surgery and she has had a lot going on with the kids going back to school.  She feels like she has plateaued with her weight loss.  States when she weighs at home she is 202 lbs naked and 204 lbs with clothes on.  She was 204 lbs at her last visit with me 2 months ago.   ROS: Per HPI  Current Outpatient Medications:  .  acetaminophen (TYLENOL) 325 MG tablet, Take 2 tablets (650 mg total) by mouth every 4 (four) hours as needed for mild pain (temperature > 101.5.)., Disp: , Rfl:  .  dicyclomine (BENTYL) 10 MG capsule, Take 1 capsule (10 mg total) by mouth 4 (four) times daily -  before meals and at bedtime. (Patient taking differently: Take 10 mg by mouth daily. ), Disp: 120 capsule, Rfl: 1 .  ibuprofen (ADVIL) 200 MG tablet, Take 3 tablets  (600 mg total) by mouth every 6 (six) hours as needed., Disp: 30 tablet, Rfl: 0 .  NORTREL 7/7/7 0.5/0.75/1-35 MG-MCG tablet, Take 1 tablet by mouth at bedtime. , Disp: , Rfl:  .  pantoprazole (PROTONIX) 40 MG tablet, TAKE 1 TABLET (40 MG TOTAL) BY MOUTH DAILY BEFORE BREAKFAST., Disp: 90 tablet, Rfl: 3 .  phentermine 37.5 MG capsule, Take 1 capsule (37.5 mg total) by mouth every morning., Disp: 30 capsule, Rfl: 1  No Known Allergies Past Medical History:  Diagnosis Date  . GERD (gastroesophageal reflux disease)   . IBS (irritable bowel syndrome)   . Rectocele   . SUI (stress urinary incontinence, female)   . Wears contact lenses     Observations/Objective: A&O  No respiratory distress or wheezing audible over the phone Mood, judgement, and thought processes all WNL   Assessment and Plan: 1. Obesity (BMI 30.0-34.9) - Continue diet and exercise.  We are going to try one more month of phentermine and if she does not lose any weight we will refer to a weight loss specialist.   Follow Up Instructions: Return in about 4 weeks (around 12/19/2019) for weight.  I discussed the assessment and treatment plan with the patient. The patient was provided an opportunity to ask questions and all were answered. The patient agreed with the plan and demonstrated an understanding of the instructions.  The patient was advised to call back or seek an in-person evaluation if the symptoms worsen or if the condition fails to improve as anticipated.  The above assessment and management plan was discussed with the patient. The patient verbalized understanding of and has agreed to the management plan. Patient is aware to call the clinic if symptoms persist or worsen. Patient is aware when to return to the clinic for a follow-up visit. Patient educated on when it is appropriate to go to the emergency department.   Time call ended: 10:17 AM  I provided 10 minutes of non-face-to-face time during this  encounter.  Deliah Boston, MSN, APRN, FNP-C [facility] 11/21/19

## 2019-12-26 MED FILL — PHENTERMINE 37.5 MG CAPSULE: 37.5 | 30 days supply | Qty: 30 | Fill #1

## 2020-01-01 ENCOUNTER — Other Ambulatory Visit (HOSPITAL_COMMUNITY): Payer: Self-pay | Admitting: Obstetrics & Gynecology

## 2020-01-01 MED FILL — DASETTA 7/7/7-28 TABLET: 0.5/0.75/1- | 84 days supply | Qty: 84 | Fill #0

## 2020-01-01 NOTE — Progress Notes (Deleted)
Referring Provider: Gwenlyn Fudge, FNP Primary Care Physician:  Gwenlyn Fudge, FNP Primary GI Physician: Dr. Jena Gauss  No chief complaint on file.   HPI:   Kendra Payne is a 44 y.o. female with history of GERD on Protonix and postprandial loose stools and abdominal cramping, suspected IBS on Bentyl who presenting today for follow-up. Last seen in our office in April 2021.  GERD was well controlled and loose stools have resolved with Bentyl once daily.  Advised she continue her current medications and follow-up in 6 months.    Past Medical History:  Diagnosis Date  . GERD (gastroesophageal reflux disease)   . IBS (irritable bowel syndrome)   . Rectocele   . SUI (stress urinary incontinence, female)   . Wears contact lenses     Past Surgical History:  Procedure Laterality Date  . BLADDER SUSPENSION N/A 08/23/2019   Procedure: ATTEMPTED TWICE  TRANSVAGINAL TAPE (TVT) PROCEDURE;  Surgeon: Shea Evans, MD;  Location: [facility];  Service: Gynecology;  Laterality: N/A;  . CYSTOSCOPY N/A 08/23/2019   Procedure: CYSTOSCOPY;  Surgeon: Shea Evans, MD;  Location: [facility];  Service: Gynecology;  Laterality: N/A;  . FRACTURE SURGERY  08/26/1998   Left Femur  . KNEE ARTHROSCOPY Left 07-06-2004  @[facility]   . ORIF FEMUR FRACTURE  08/1999  . RECTOCELE REPAIR N/A 08/23/2019   Procedure: POSTERIOR REPAIR (RECTOCELE)/Perineorrhaphy;  Surgeon: 08/25/2019, MD;  Location: [facility];  Service: Gynecology;  Laterality: N/A;  Requests 2 hrs.    Current Outpatient Medications  Medication Sig Dispense Refill  . acetaminophen (TYLENOL) 325 MG tablet Take 2 tablets (650 mg total) by mouth every 4 (four) hours as needed for mild pain (temperature > 101.5.).    [facility] dicyclomine (BENTYL) 10 MG capsule Take 1 capsule (10 mg total) by mouth 4 (four) times daily -  before meals and at bedtime. (Patient taking differently: Take 10 mg by mouth daily. )  120 capsule 1  . ibuprofen (ADVIL) 200 MG tablet Take 3 tablets (600 mg total) by mouth every 6 (six) hours as needed. 30 tablet 0  . NORTREL 7/7/7 0.5/0.75/1-35 MG-MCG tablet Take 1 tablet by mouth at bedtime.     . pantoprazole (PROTONIX) 40 MG tablet TAKE 1 TABLET (40 MG TOTAL) BY MOUTH DAILY BEFORE BREAKFAST. 90 tablet 3  . phentermine 37.5 MG capsule Take 1 capsule (37.5 mg total) by mouth every morning. 30 capsule 1   No current facility-administered medications for this visit.    Allergies as of 01/02/2020  . (No Known Allergies)    Family History  Problem Relation Age of Onset  . Hypertension Mother   . Hypertension Father   . Cancer Maternal Grandmother   . Diabetes Maternal Grandmother   . Cancer Maternal Grandfather   . Diabetes Maternal Grandfather   . Breast cancer Maternal Aunt   . Colon cancer Neg Hx     Social History   Socioeconomic History  . Marital status: Married    Spouse name: 01/04/2020   . Number of children: 3  . Years of education: Not on file  . Highest education level: Associate degree: academic program  Occupational History  . 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 Smoking status: Never Smoker  . Smokeless tobacco: Never Used  Vaping Use  . Vaping Use: Never used  Substance and Sexual Activity  .  Alcohol use: No  . Drug use: No  . Sexual activity: Yes    Birth control/protection: Pill  Other Topics Concern  . Not on file  Social History Narrative  . Not on file   Social Determinants of Health   Financial Resource Strain:   . Difficulty of Paying Living Expenses: Not on file  Food Insecurity:   . Worried About Programme researcher, broadcasting/film/video in the Last Year: Not on file  . Ran Out of Food in the Last Year: Not on file  Transportation Needs:   . Lack of Transportation (Medical): Not on file  . Lack of Transportation (Non-Medical): Not on file  Physical Activity:   . Days of  Exercise per Week: Not on file  . Minutes of Exercise per Session: Not on file  Stress:   . Feeling of Stress : Not on file  Social Connections:   . Frequency of Communication with Friends and Family: Not on file  . Frequency of Social Gatherings with Friends and Family: Not on file  . Attends Religious Services: Not on file  . Active Member of Clubs or Organizations: Not on file  . Attends Banker Meetings: Not on file  . Marital Status: Not on file    Review of Systems: Gen: Denies fever, chills, anorexia. Denies fatigue, weakness, weight loss.  CV: Denies chest pain, palpitations, syncope, peripheral edema, and claudication. Resp: Denies dyspnea at rest, cough, wheezing, coughing up blood, and pleurisy. GI: Denies vomiting blood, jaundice, and fecal incontinence.   Denies dysphagia or odynophagia. Derm: Denies rash, itching, dry skin Psych: Denies depression, anxiety, memory loss, confusion. No homicidal or suicidal ideation.  Heme: Denies bruising, bleeding, and enlarged lymph nodes.  Physical Exam: There were no vitals taken for this visit. General:   Alert and oriented. No distress noted. Pleasant and cooperative.  Head:  Normocephalic and atraumatic. Eyes:  Conjuctiva clear without scleral icterus. Mouth:  Oral mucosa pink and moist. Good dentition. No lesions. Heart:  S1, S2 present without murmurs appreciated. Lungs:  Clear to auscultation bilaterally. No wheezes, rales, or rhonchi. No distress.  Abdomen:  +BS, soft, non-tender and non-distended. No rebound or guarding. No HSM or masses noted. Msk:  Symmetrical without gross deformities. Normal posture. Extremities:  Without edema. Neurologic:  Alert and  oriented x4 Psych:  Alert and cooperative. Normal mood and affect.

## 2020-01-02 ENCOUNTER — Ambulatory Visit: Payer: No Typology Code available for payment source | Admitting: Gastroenterology

## 2020-01-16 MED FILL — PANTOPRAZOLE SOD DR 40 MG T: 40 | 90 days supply | Qty: 90 | Fill #1

## 2020-01-17 ENCOUNTER — Other Ambulatory Visit: Payer: Self-pay | Admitting: Family Medicine

## 2020-01-17 ENCOUNTER — Other Ambulatory Visit: Payer: Self-pay | Admitting: Family

## 2020-01-17 DIAGNOSIS — Z8619 Personal history of other infectious and parasitic diseases: Secondary | ICD-10-CM

## 2020-01-17 MED FILL — valACYclovir HCL 1 GM TABS: 1 | 5 days supply | Qty: 20 | Fill #0

## 2020-03-16 MED FILL — NORTREL 7/7/7-28 TABLET: 0.5/0.75/1- | 84 days supply | Qty: 84 | Fill #1

## 2020-04-09 ENCOUNTER — Other Ambulatory Visit: Payer: No Typology Code available for payment source

## 2020-04-09 DIAGNOSIS — Z20822 Contact with and (suspected) exposure to covid-19: Secondary | ICD-10-CM

## 2020-04-10 LAB — NOVEL CORONAVIRUS, NAA: SARS-CoV-2, NAA: NOT DETECTED

## 2020-04-10 LAB — SARS-COV-2, NAA 2 DAY TAT

## 2020-06-03 MED FILL — valACYclovir HCL 1 GM TABS: 1 | 5 days supply | Qty: 20 | Fill #1

## 2020-06-03 MED FILL — PANTOPRAZOLE SOD DR 40 MG T: 40 | 90 days supply | Qty: 90 | Fill #2

## 2020-06-16 ENCOUNTER — Other Ambulatory Visit (HOSPITAL_COMMUNITY): Payer: Self-pay

## 2020-06-22 ENCOUNTER — Other Ambulatory Visit (HOSPITAL_COMMUNITY): Payer: Self-pay

## 2020-06-22 MED FILL — Norethindrone-Eth Estradiol Tab 0.5-35/0.75-35/1-35 MG-MCG: ORAL | 84 days supply | Qty: 84 | Fill #0 | Status: AC

## 2020-07-27 ENCOUNTER — Other Ambulatory Visit: Payer: Self-pay | Admitting: Gastroenterology

## 2020-07-27 ENCOUNTER — Other Ambulatory Visit (HOSPITAL_COMMUNITY): Payer: Self-pay

## 2020-07-27 DIAGNOSIS — R195 Other fecal abnormalities: Secondary | ICD-10-CM

## 2020-07-28 ENCOUNTER — Other Ambulatory Visit (HOSPITAL_COMMUNITY): Payer: Self-pay

## 2020-07-28 MED ORDER — DICYCLOMINE HCL 10 MG PO CAPS
10.0000 mg | ORAL_CAPSULE | Freq: Three times a day (TID) | ORAL | 1 refills | Status: DC
  Filled 2020-07-28: qty 120, 30d supply, fill #0

## 2020-07-28 NOTE — Telephone Encounter (Signed)
Refilled dicyclomine but needs follow-up with Kristen.

## 2020-08-07 ENCOUNTER — Other Ambulatory Visit (HOSPITAL_COMMUNITY): Payer: Self-pay

## 2020-08-07 MED ORDER — NORTREL 7/7/7 0.5/0.75/1-35 MG-MCG PO TABS
1.0000 | ORAL_TABLET | Freq: Every day | ORAL | 4 refills | Status: DC
  Filled 2020-08-07 – 2020-09-09 (×2): qty 84, 84d supply, fill #0
  Filled 2020-09-11: qty 28, 28d supply, fill #0
  Filled 2020-09-11: qty 84, 84d supply, fill #0
  Filled 2020-09-11: qty 28, 28d supply, fill #0
  Filled 2020-10-07: qty 84, 84d supply, fill #1

## 2020-08-14 NOTE — Telephone Encounter (Signed)
Ov made °

## 2020-08-25 ENCOUNTER — Encounter: Payer: No Typology Code available for payment source | Admitting: Family Medicine

## 2020-09-08 ENCOUNTER — Other Ambulatory Visit (HOSPITAL_COMMUNITY): Payer: Self-pay

## 2020-09-09 ENCOUNTER — Encounter: Payer: No Typology Code available for payment source | Admitting: Family Medicine

## 2020-09-09 ENCOUNTER — Other Ambulatory Visit (HOSPITAL_COMMUNITY): Payer: Self-pay

## 2020-09-09 MED FILL — Pantoprazole Sodium EC Tab 40 MG (Base Equiv): ORAL | 90 days supply | Qty: 90 | Fill #0 | Status: AC

## 2020-09-10 ENCOUNTER — Other Ambulatory Visit (HOSPITAL_COMMUNITY): Payer: Self-pay

## 2020-09-11 ENCOUNTER — Other Ambulatory Visit: Payer: Self-pay

## 2020-09-11 ENCOUNTER — Encounter: Payer: Self-pay | Admitting: Family Medicine

## 2020-09-11 ENCOUNTER — Other Ambulatory Visit (HOSPITAL_COMMUNITY): Payer: Self-pay

## 2020-09-11 ENCOUNTER — Ambulatory Visit (INDEPENDENT_AMBULATORY_CARE_PROVIDER_SITE_OTHER): Payer: No Typology Code available for payment source | Admitting: Family Medicine

## 2020-09-11 VITALS — BP 110/83 | HR 68 | Temp 97.9°F | Ht 67.0 in | Wt 225.0 lb

## 2020-09-11 DIAGNOSIS — E669 Obesity, unspecified: Secondary | ICD-10-CM | POA: Diagnosis not present

## 2020-09-11 DIAGNOSIS — K219 Gastro-esophageal reflux disease without esophagitis: Secondary | ICD-10-CM | POA: Diagnosis not present

## 2020-09-11 DIAGNOSIS — R200 Anesthesia of skin: Secondary | ICD-10-CM

## 2020-09-11 DIAGNOSIS — Z0001 Encounter for general adult medical examination with abnormal findings: Secondary | ICD-10-CM | POA: Diagnosis not present

## 2020-09-11 DIAGNOSIS — Z8619 Personal history of other infectious and parasitic diseases: Secondary | ICD-10-CM | POA: Diagnosis not present

## 2020-09-11 DIAGNOSIS — Z0000 Encounter for general adult medical examination without abnormal findings: Secondary | ICD-10-CM

## 2020-09-11 NOTE — Patient Instructions (Signed)
Preventive Care 43-43 Years Old, Female Preventive care refers to lifestyle choices and visits with your health care provider that can promote health and wellness. This includes: A yearly physical exam. This is also called an annual wellness visit. Regular dental and eye exams. Immunizations. Screening for certain conditions. Healthy lifestyle choices, such as: Eating a healthy diet. Getting regular exercise. Not using drugs or products that contain nicotine and tobacco. Limiting alcohol use. What can I expect for my preventive care visit? Physical exam Your health care provider will check your: Height and weight. These may be used to calculate your BMI (body mass index). BMI is a measurement that tells if you are at a healthy weight. Heart rate and blood pressure. Body temperature. Skin for abnormal spots. Counseling Your health care provider may ask you questions about your: Past medical problems. Family's medical history. Alcohol, tobacco, and drug use. Emotional well-being. Home life and relationship well-being. Sexual activity. Diet, exercise, and sleep habits. Work and work Statistician. Access to firearms. Method of birth control. Menstrual cycle. Pregnancy history. What immunizations do I need?  Vaccines are usually given at various ages, according to a schedule. Your health care provider will recommend vaccines for you based on your age, medicalhistory, and lifestyle or other factors, such as travel or where you work. What tests do I need? Blood tests Lipid and cholesterol levels. These may be checked every 5 years, or more often if you are over 43 years old. Hepatitis C test. Hepatitis B test. Screening Lung cancer screening. You may have this screening every year starting at age 43 if you have a 30-pack-year history of smoking and currently smoke or have quit within the past 15 years. Colorectal cancer screening. All adults should have this screening starting at  age 43 and continuing until age 43. Your health care provider may recommend screening at age 43 if you are at increased risk. You will have tests every 1-10 years, depending on your results and the type of screening test. Diabetes screening. This is done by checking your blood sugar (glucose) after you have not eaten for a while (fasting). You may have this done every 1-3 years. Mammogram. This may be done every 1-2 years. Talk with your health care provider about when you should start having regular mammograms. This may depend on whether you have a family history of breast cancer. BRCA-related cancer screening. This may be done if you have a family history of breast, ovarian, tubal, or peritoneal cancers. Pelvic exam and Pap test. This may be done every 3 years starting at age 43. Starting at age 43, this may be done every 5 years if you have a Pap test in combination with an HPV test. Other tests STD (sexually transmitted disease) testing, if you are at risk. Bone density scan. This is done to screen for osteoporosis. You may have this scan if you are at high risk for osteoporosis. Talk with your health care provider about your test results, treatment options,and if necessary, the need for more tests. Follow these instructions at home: Eating and drinking  Eat a diet that includes fresh fruits and vegetables, whole grains, lean protein, and low-fat dairy products. Take vitamin and mineral supplements as recommended by your health care provider. Do not drink alcohol if: Your health care provider tells you not to drink. You are pregnant, may be pregnant, or are planning to become pregnant. If you drink alcohol: Limit how much you have to 0-1 drink a day. Be aware  of how much alcohol is in your drink. In the [place] one drink equals one 12 oz bottle of beer (355 mL), one 5 oz glass of wine (148 mL), or one 1 oz glass of hard liquor (44 mL).  Lifestyle Take daily care of your teeth and  gums. Brush your teeth every morning and night with fluoride toothpaste. Floss one time each day. Stay active. Exercise for at least 30 minutes 5 or more days each week. Do not use any products that contain nicotine or tobacco, such as cigarettes, e-cigarettes, and chewing tobacco. If you need help quitting, ask your health care provider. Do not use drugs. If you are sexually active, practice safe sex. Use a condom or other form of protection to prevent STIs (sexually transmitted infections). If you do not wish to become pregnant, use a form of birth control. If you plan to become pregnant, see your health care provider for a prepregnancy visit. If told by your health care provider, take low-dose aspirin daily starting at age 43. Find healthy ways to cope with stress, such as: Meditation, yoga, or listening to music. Journaling. Talking to a trusted person. Spending time with friends and family. Safety Always wear your seat belt while driving or riding in a vehicle. Do not drive: If you have been drinking alcohol. Do not ride with someone who has been drinking. When you are tired or distracted. While texting. Wear a helmet and other protective equipment during sports activities. If you have firearms in your house, make sure you follow all gun safety procedures. What's next? Visit your health care provider once a year for an annual wellness visit. Ask your health care provider how often you should have your eyes and teeth checked. Stay up to date on all vaccines. This information is not intended to replace advice given to you by your health care provider. Make sure you discuss any questions you have with your healthcare provider. Document Revised: 11/26/2019 Document Reviewed: 11/02/2017 Elsevier Patient Education  2022 [employer].

## 2020-09-11 NOTE — Progress Notes (Signed)
Assessment & Plan:  1. Well adult exam - Preventive health education provided. Requesting records for mammogram and pap smear. Declined hepatitis C screening and COVID booster. - CBC with Differential/Platelet - CMP14+EGFR - Lipid panel  2. Gastroesophageal reflux disease, unspecified whether esophagitis present Well controlled on current regimen.  - CMP14+EGFR  3. Obesity (BMI 30-39.9) Diet and exercise encouraged. - CBC with Differential/Platelet - CMP14+EGFR - Lipid panel  4. History of cold sores Well controlled on current regimen.   5. Numbness of left hand Education provided on carpal tunnel. Encouraged her to wear a wrist splint at night. - TSH  6. Numbness of finger Education provided on Raynaud's.  - TSH   Follow-up: Return in about 1 year (around 09/11/2021) for annual physical.   Hendricks Limes, MSN, APRN, FNP-C [facility]  Subjective:  Patient ID: Kendra Payne, female    DOB: 06-09-1975  Age: 45 y.o. MRN: [id number]  Patient Care Team: Loman Brooklyn, FNP as PCP - General (Family Medicine) Gala Romney, Cristopher Estimable, MD as Consulting Physician (Gastroenterology)   CC:  Chief Complaint  Patient presents with   Annual Exam    HPI Kendra Payne presents for her annual physical.   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[employer], Marital status: married, Substance use: none Diet: Regular, Exercise: None Last eye exam: April 2022 Last dental exam: A few months ago Last mammogram: Recently with OBGYN (record requested) Last pap smear: Recently with OBGYN (record requested) Immunizations: Flu Vaccine:  not flu season Tdap Vaccine: up to date  COVID: has had the first two doses  DEPRESSION SCREENING PHQ 2/9 Scores 09/11/2020 09/20/2019 07/02/2019 05/29/2019 12/22/2017 06/16/2017 01/12/2017  PHQ - 2 Score 0 0 0 0 0 0 0  PHQ- 9 Score 0 - - - - - -     Patient reports her left hand goes to sleep at night.  She also reports  occasionally the tip of her middle finger gets white and numb when she is cold.   Review of Systems  Constitutional:  Negative for chills, fever, malaise/fatigue and weight loss.  HENT:  Negative for congestion, ear discharge, ear pain, nosebleeds, sinus pain, sore throat and tinnitus.   Eyes:  Negative for blurred vision, double vision, pain, discharge and redness.  Respiratory:  Negative for cough, shortness of breath and wheezing.   Cardiovascular:  Negative for chest pain, palpitations and leg swelling.  Gastrointestinal:  Negative for abdominal pain, constipation, diarrhea, heartburn, nausea and vomiting.  Genitourinary:  Negative for dysuria, frequency and urgency.  Musculoskeletal:  Negative for myalgias.  Skin:  Negative for rash.  Neurological:  Negative for dizziness, seizures, weakness and headaches.  Psychiatric/Behavioral:  Negative for depression, substance abuse and suicidal ideas. The patient is not nervous/anxious.     Current Outpatient Medications:    acetaminophen (TYLENOL) 325 MG tablet, Take 2 tablets (650 mg total) by mouth every 4 (four) hours as needed for mild pain (temperature > 101.5.)., Disp: , Rfl:    dicyclomine (BENTYL) 10 MG capsule, Take 1 capsule (10 mg total) by mouth 4 (four) times daily -  before meals and at bedtime., Disp: 120 capsule, Rfl: 1   ibuprofen (ADVIL) 200 MG tablet, Take 3 tablets (600 mg total) by mouth every 6 (six) hours as needed., Disp: 30 tablet, Rfl: 0   NORTREL 7/7/7 0.5/0.75/1-35 MG-MCG tablet, Take 1 tablet by mouth at bedtime. , Disp: , Rfl:    pantoprazole (PROTONIX) 40 MG tablet, TAKE 1  TABLET (40 MG TOTAL) BY MOUTH DAILY BEFORE BREAKFAST., Disp: 90 tablet, Rfl: 3   valACYclovir (VALTREX) 1000 MG tablet, TAKE 2 TABLETS BY MOUTH EVERY 12 HOURS FOR ONE DAY. USE AS NEEDED., Disp: 20 tablet, Rfl: 2  No Known Allergies  Past Medical History:  Diagnosis Date   GERD (gastroesophageal reflux disease)    IBS (irritable bowel  syndrome)    Rectocele    SUI (stress urinary incontinence, female)    Wears contact lenses     Past Surgical History:  Procedure Laterality Date   BLADDER SUSPENSION N/A 08/23/2019   Procedure: ATTEMPTED TWICE  TRANSVAGINAL TAPE (TVT) PROCEDURE;  Surgeon: Azucena Fallen, MD;  Location: [facility];  Service: Gynecology;  Laterality: N/A;   CYSTOSCOPY N/A 08/23/2019   Procedure: CYSTOSCOPY;  Surgeon: Azucena Fallen, MD;  Location: [facility];  Service: Gynecology;  Laterality: N/A;   FRACTURE SURGERY  08/26/1998   Left Femur   KNEE ARTHROSCOPY Left 07-06-2004  '@[facility]'    ORIF FEMUR FRACTURE  08/1999   RECTOCELE REPAIR N/A 08/23/2019   Procedure: POSTERIOR REPAIR (RECTOCELE)/Perineorrhaphy;  Surgeon: Azucena Fallen, MD;  Location: [facility];  Service: Gynecology;  Laterality: N/A;  Requests 2 hrs.    Family History  Problem Relation Age of Onset   Hypertension Mother    Hypertension Father    Cancer Maternal Grandmother    Diabetes Maternal Grandmother    Cancer Maternal Grandfather    Diabetes Maternal Grandfather    Breast cancer Maternal Aunt    Colon cancer Neg Hx     Social History   Socioeconomic History   Marital status: Married    Spouse name: Stage manager    Number of children: 3   Years of education: Not on file   Highest education level: Associate degree: academic program  Occupational History   Occupation: Investment banker, operational: [employer]    Comment: Conservator, museum/gallery  Tobacco Use   Smoking status: Never   Smokeless tobacco: Never  Vaping Use   Vaping Use: Never used  Substance and Sexual Activity   Alcohol use: No   Drug use: No   Sexual activity: Yes    Birth control/protection: Pill  Other Topics Concern   Not on file  Social History Narrative   Not on file   Social Determinants of Health   Financial Resource Strain: Not on file  Food Insecurity: Not on file  Transportation Needs: Not on file   Physical Activity: Not on file  Stress: Not on file  Social Connections: Not on file  Intimate Partner Violence: Not on file      Objective:    BP 110/83   Pulse 68   Temp 97.9 F (36.6 C) (Temporal)   Ht '5\' 7"'  (1.702 m)   Wt 225 lb (102.1 kg)   SpO2 100%   BMI 35.24 kg/m   Wt Readings from Last 3 Encounters:  09/11/20 225 lb (102.1 kg)  09/20/19 204 lb 3.2 oz (92.6 kg)  08/23/19 205 lb 6.4 oz (93.2 kg)    Physical Exam Vitals reviewed.  Constitutional:      General: She is not in acute distress.    Appearance: Normal appearance. She is obese. She is not ill-appearing, toxic-appearing or diaphoretic.  HENT:     Head: Normocephalic and atraumatic.     Right Ear: Tympanic membrane, ear canal and external ear normal. There is no impacted cerumen.     Left Ear:  Tympanic membrane, ear canal and external ear normal. There is no impacted cerumen.     Nose: Nose normal. No congestion or rhinorrhea.     Mouth/Throat:     Mouth: Mucous membranes are moist.     Pharynx: Oropharynx is clear. No oropharyngeal exudate or posterior oropharyngeal erythema.  Eyes:     General: No scleral icterus.       Right eye: No discharge.        Left eye: No discharge.     Conjunctiva/sclera: Conjunctivae normal.     Pupils: Pupils are equal, round, and reactive to light.  Cardiovascular:     Rate and Rhythm: Normal rate and regular rhythm.     Heart sounds: Normal heart sounds. No murmur heard.   No friction rub. No gallop.  Pulmonary:     Effort: Pulmonary effort is normal. No respiratory distress.     Breath sounds: Normal breath sounds. No stridor. No wheezing, rhonchi or rales.  Abdominal:     General: Abdomen is flat. Bowel sounds are normal. There is no distension.     Palpations: Abdomen is soft. There is no mass.     Tenderness: There is no abdominal tenderness. There is no guarding or rebound.     Hernia: No hernia is present.  Musculoskeletal:        General: Normal range of  motion.     Cervical back: Normal range of motion and neck supple. No rigidity. No muscular tenderness.  Lymphadenopathy:     Cervical: No cervical adenopathy.  Skin:    General: Skin is warm and dry.     Capillary Refill: Capillary refill takes less than 2 seconds.  Neurological:     General: No focal deficit present.     Mental Status: She is alert and oriented to person, place, and time. Mental status is at baseline.  Psychiatric:        Mood and Affect: Mood normal.        Behavior: Behavior normal.        Thought Content: Thought content normal.        Judgment: Judgment normal.   Lab Results  Component Value Date   TSH 1.450 01/12/2017   Lab Results  Component Value Date   WBC 7.0 08/20/2019   HGB 13.2 08/20/2019   HCT 41.5 08/20/2019   MCV 94.3 08/20/2019   PLT 360 08/20/2019   Lab Results  Component Value Date   NA 138 08/20/2019   K 4.3 08/20/2019   CO2 24 08/20/2019   GLUCOSE 95 08/20/2019   BUN 14 08/20/2019   CREATININE 0.80 08/20/2019   BILITOT 0.3 05/29/2019   ALKPHOS 62 05/29/2019   AST 13 05/29/2019   ALT 13 05/29/2019   PROT 7.2 05/29/2019   ALBUMIN 4.2 05/29/2019   CALCIUM 9.2 08/20/2019   ANIONGAP 9 08/20/2019   Lab Results  Component Value Date   CHOL 156 05/29/2019   Lab Results  Component Value Date   HDL 42 05/29/2019   Lab Results  Component Value Date   LDLCALC 96 05/29/2019   Lab Results  Component Value Date   TRIG 96 05/29/2019   Lab Results  Component Value Date   CHOLHDL 3.7 05/29/2019   No results found for: HGBA1C

## 2020-09-12 LAB — CMP14+EGFR
ALT: 12 IU/L (ref 0–32)
AST: 17 IU/L (ref 0–40)
Albumin/Globulin Ratio: 1.3 (ref 1.2–2.2)
Albumin: 4 g/dL (ref 3.8–4.8)
Alkaline Phosphatase: 58 IU/L (ref 44–121)
BUN/Creatinine Ratio: 16 (ref 9–23)
BUN: 11 mg/dL (ref 6–24)
Bilirubin Total: 0.3 mg/dL (ref 0.0–1.2)
CO2: 19 mmol/L — ABNORMAL LOW (ref 20–29)
Calcium: 9 mg/dL (ref 8.7–10.2)
Chloride: 102 mmol/L (ref 96–106)
Creatinine, Ser: 0.7 mg/dL (ref 0.57–1.00)
Globulin, Total: 3.2 g/dL (ref 1.5–4.5)
Glucose: 83 mg/dL (ref 65–99)
Potassium: 4.6 mmol/L (ref 3.5–5.2)
Sodium: 138 mmol/L (ref 134–144)
Total Protein: 7.2 g/dL (ref 6.0–8.5)
eGFR: 111 mL/min/{1.73_m2} (ref >59)

## 2020-09-12 LAB — LIPID PANEL
Chol/HDL Ratio: 3.6 ratio (ref 0.0–4.4)
Cholesterol, Total: 181 mg/dL (ref 100–199)
HDL: 50 mg/dL (ref >39)
LDL Chol Calc (NIH): 118 mg/dL — ABNORMAL HIGH (ref 0–99)
Triglycerides: 67 mg/dL (ref 0–149)
VLDL Cholesterol Cal: 13 mg/dL (ref 5–40)

## 2020-09-12 LAB — CBC WITH DIFFERENTIAL/PLATELET
Basophils Absolute: 0 10*3/uL (ref 0.0–0.2)
Basos: 0 %
EOS (ABSOLUTE): 0.1 10*3/uL (ref 0.0–0.4)
Eos: 2 %
Hematocrit: 39.3 % (ref 34.0–46.6)
Hemoglobin: 13.1 g/dL (ref 11.1–15.9)
Immature Grans (Abs): 0 10*3/uL (ref 0.0–0.1)
Immature Granulocytes: 0 %
Lymphocytes Absolute: 2.2 10*3/uL (ref 0.7–3.1)
Lymphs: 35 %
MCH: 29.5 pg (ref 26.6–33.0)
MCHC: 33.3 g/dL (ref 31.5–35.7)
MCV: 89 fL (ref 79–97)
Monocytes Absolute: 0.5 10*3/uL (ref 0.1–0.9)
Monocytes: 9 %
Neutrophils Absolute: 3.4 10*3/uL (ref 1.4–7.0)
Neutrophils: 54 %
Platelets: 340 10*3/uL (ref 150–450)
RBC: 4.44 x10E6/uL (ref 3.77–5.28)
RDW: 12.3 % (ref 11.7–15.4)
WBC: 6.3 10*3/uL (ref 3.4–10.8)

## 2020-09-12 LAB — TSH: TSH: 1.22 u[IU]/mL (ref 0.450–4.500)

## 2020-09-15 ENCOUNTER — Encounter: Payer: Self-pay | Admitting: Family Medicine

## 2020-10-07 ENCOUNTER — Other Ambulatory Visit (HOSPITAL_COMMUNITY): Payer: Self-pay

## 2020-10-22 ENCOUNTER — Ambulatory Visit (INDEPENDENT_AMBULATORY_CARE_PROVIDER_SITE_OTHER): Payer: No Typology Code available for payment source | Admitting: Family Medicine

## 2020-10-22 ENCOUNTER — Encounter: Payer: Self-pay | Admitting: Family Medicine

## 2020-10-22 ENCOUNTER — Other Ambulatory Visit: Payer: Self-pay

## 2020-10-22 ENCOUNTER — Other Ambulatory Visit (HOSPITAL_COMMUNITY): Payer: Self-pay

## 2020-10-22 VITALS — BP 111/81 | HR 86 | Temp 97.7°F | Ht 67.0 in | Wt 227.0 lb

## 2020-10-22 DIAGNOSIS — E669 Obesity, unspecified: Secondary | ICD-10-CM

## 2020-10-22 MED ORDER — PHENTERMINE HCL 37.5 MG PO TABS
37.5000 mg | ORAL_TABLET | Freq: Every day | ORAL | 0 refills | Status: DC
  Filled 2020-10-22: qty 30, 30d supply, fill #0

## 2020-10-22 NOTE — Patient Instructions (Signed)
Reminder: please return after mid-September for your flu shot. If you receive this elsewhere, such as your pharmacy, please let us know so we can get this documented in your chart.   

## 2020-10-22 NOTE — Progress Notes (Signed)
Assessment & Plan:  1. Obesity (BMI 30-39.9) - printed education materials provided - encouraged diet and exercise and lifestyle modifications  - prescription 1, starting weight 227 lbs - phentermine (ADIPEX-P) 37.5 MG tablet; Take 1 tablet (37.5 mg total) by mouth daily before breakfast.  Dispense: 30 tablet; Refill: 0   Return in about 4 weeks (around 11/19/2020) for Weight.  Lucile Crater, NP Student   Subjective:    Patient ID: Kendra Payne, female    DOB: 11-15-1975, 45 y.o.   MRN: [id number]  Patient Care Team: Loman Brooklyn, FNP as PCP - General (Family Medicine) Gala Romney Cristopher Estimable, MD as Consulting Physician (Gastroenterology) Obgyn, Erling Conte   Chief Complaint:  Chief Complaint  Patient presents with   Weight Loss    HPI: Kendra Payne is a 45 y.o. female presenting on 10/22/2020 for Weight Loss  She is interested in weight loss by using weight watchers, exercising, and restarting phentermine.  New complaints: none  Social history:  Relevant past medical, surgical, family and social history reviewed and updated as indicated. Interim medical history since our last visit reviewed.  Allergies and medications reviewed and updated.  DATA REVIEWED: CHART IN EPIC  ROS: Negative unless specifically indicated above in HPI.    Current Outpatient Medications:    acetaminophen (TYLENOL) 325 MG tablet, Take 2 tablets (650 mg total) by mouth every 4 (four) hours as needed for mild pain (temperature > 101.5.)., Disp: , Rfl:    dicyclomine (BENTYL) 10 MG capsule, Take 1 capsule (10 mg total) by mouth 4 (four) times daily -  before meals and at bedtime., Disp: 120 capsule, Rfl: 1   ibuprofen (ADVIL) 200 MG tablet, Take 3 tablets (600 mg total) by mouth every 6 (six) hours as needed., Disp: 30 tablet, Rfl: 0   NORTREL 7/7/7 0.5/0.75/1-35 MG-MCG tablet, Take 1 tablet by mouth at bedtime. , Disp: , Rfl:    pantoprazole (PROTONIX) 40 MG tablet, TAKE 1 TABLET (40 MG  TOTAL) BY MOUTH DAILY BEFORE BREAKFAST., Disp: 90 tablet, Rfl: 3   phentermine (ADIPEX-P) 37.5 MG tablet, Take 1 tablet (37.5 mg total) by mouth daily before breakfast., Disp: 30 tablet, Rfl: 0   valACYclovir (VALTREX) 1000 MG tablet, TAKE 2 TABLETS BY MOUTH EVERY 12 HOURS FOR ONE DAY. USE AS NEEDED., Disp: 20 tablet, Rfl: 2   No Known Allergies Past Medical History:  Diagnosis Date   GERD (gastroesophageal reflux disease)    IBS (irritable bowel syndrome)    Perforation of bladder during operative procedure 08/24/2019   While placing TVT Exact. On right lateral upper side, not involving trigone    Rectocele    SUI (stress urinary incontinence, female)    Wears contact lenses     Past Surgical History:  Procedure Laterality Date   BLADDER SUSPENSION N/A 08/23/2019   Procedure: ATTEMPTED TWICE  TRANSVAGINAL TAPE (TVT) PROCEDURE;  Surgeon: Azucena Fallen, MD;  Location: [facility];  Service: Gynecology;  Laterality: N/A;   CYSTOSCOPY N/A 08/23/2019   Procedure: CYSTOSCOPY;  Surgeon: Azucena Fallen, MD;  Location: [facility];  Service: Gynecology;  Laterality: N/A;   FRACTURE SURGERY  08/26/1998   Left Femur   KNEE ARTHROSCOPY Left 07-06-2004  _0    ORIF FEMUR FRACTURE  08/1999   RECTOCELE REPAIR N/A 08/23/2019   Procedure: POSTERIOR REPAIR (RECTOCELE)/Perineorrhaphy;  Surgeon: Azucena Fallen, MD;  Location: [facility];  Service: Gynecology;  Laterality: N/A;  Requests 2 hrs.    Social  History   Socioeconomic History   Marital status: Married    Spouse name: [name]    Number of children: 3   Years of education: Not on file   Highest education level: Associate degree: academic program  Occupational History   Occupation: Investment banker, operational: [employer]    Comment: Conservator, museum/gallery  Tobacco Use   Smoking status: Never   Smokeless tobacco: Never  Vaping Use   Vaping Use: Never used  Substance and Sexual Activity   Alcohol  use: No   Drug use: No   Sexual activity: Yes    Birth control/protection: Pill  Other Topics Concern   Not on file  Social History Narrative   Not on file   Social Determinants of Health   Financial Resource Strain: Not on file  Food Insecurity: Not on file  Transportation Needs: Not on file  Physical Activity: Not on file  Stress: Not on file  Social Connections: Not on file  Intimate Partner Violence: Not on file        Objective:    BP 111/81   Pulse 86   Temp 97.7 F (36.5 C) (Temporal)   Ht _0  (1.702 m)   Wt 103 kg   BMI 35.55 kg/m   Wt Readings from Last 3 Encounters:  10/22/20 103 kg  09/11/20 102.1 kg  09/20/19 92.6 kg    Physical Exam Constitutional:      General: She is not in acute distress.    Appearance: Normal appearance. She is obese. She is not ill-appearing, toxic-appearing or diaphoretic.  HENT:     Head: Normocephalic.  Eyes:     Extraocular Movements: Extraocular movements intact.     Conjunctiva/sclera: Conjunctivae normal.     Pupils: Pupils are equal, round, and reactive to light.  Cardiovascular:     Rate and Rhythm: Regular rhythm.  Pulmonary:     Effort: Pulmonary effort is normal.  Abdominal:     Palpations: Abdomen is soft.  Musculoskeletal:        General: Normal range of motion.     Cervical back: Normal range of motion.  Skin:    General: Skin is warm and dry.  Neurological:     General: No focal deficit present.     Mental Status: She is alert and oriented to person, place, and time. Mental status is at baseline.  Psychiatric:        Mood and Affect: Mood normal.        Behavior: Behavior normal.        Thought Content: Thought content normal.        Judgment: Judgment normal.    Lab Results  Component Value Date   TSH 1.220 09/11/2020   Lab Results  Component Value Date   WBC 6.3 09/11/2020   HGB 13.1 09/11/2020   HCT 39.3 09/11/2020   MCV 89 09/11/2020   PLT 340 09/11/2020   Lab Results  Component  Value Date   NA 138 09/11/2020   K 4.6 09/11/2020   CO2 19 (L) 09/11/2020   GLUCOSE 83 09/11/2020   BUN 11 09/11/2020   CREATININE 0.70 09/11/2020   BILITOT 0.3 09/11/2020   ALKPHOS 58 09/11/2020   AST 17 09/11/2020   ALT 12 09/11/2020   PROT 7.2 09/11/2020   ALBUMIN 4.0 09/11/2020   CALCIUM 9.0 09/11/2020   ANIONGAP 9 08/20/2019   EGFR 111 09/11/2020   Lab Results  Component Value Date  CHOL 181 09/11/2020   Lab Results  Component Value Date   HDL 50 09/11/2020   Lab Results  Component Value Date   LDLCALC 118 (H) 09/11/2020   Lab Results  Component Value Date   TRIG 67 09/11/2020   Lab Results  Component Value Date   CHOLHDL 3.6 09/11/2020   No results found for: HGBA1C

## 2020-11-19 ENCOUNTER — Ambulatory Visit: Payer: No Typology Code available for payment source | Admitting: Family Medicine

## 2020-12-09 NOTE — Progress Notes (Deleted)
Referring Provider: Gwenlyn Fudge, FNP Primary Care Physician:  Gwenlyn Fudge, FNP Primary GI Physician: Dr. Bonnetta Barry chief complaint on file.   HPI:   Kendra Payne is a 45 y.o. female with history of GERD and postprandial loose stools likely secondary to IBS presenting today for follow-up.  Last seen in our office 07/03/2019.  GERD was much improved/resolved on Protonix 40 mg daily.  She had also made dietary changes, started phentermine, and was following weight watchers.  Postprandial loose stools with associated abdominal cramping also resolved with dicyclomine once daily.  Advised to continue Protonix 40 mg daily, continue weight loss efforts, continue Bentyl daily, and follow-up in 6 months.  Patient canceled her follow-up appointment.  Today:    Past Medical History:  Diagnosis Date   GERD (gastroesophageal reflux disease)    IBS (irritable bowel syndrome)    Perforation of bladder during operative procedure 08/24/2019   While placing TVT Exact. On right lateral upper side, not involving trigone    Rectocele    SUI (stress urinary incontinence, female)    Wears contact lenses     Past Surgical History:  Procedure Laterality Date   BLADDER SUSPENSION N/A 08/23/2019   Procedure: ATTEMPTED TWICE  TRANSVAGINAL TAPE (TVT) PROCEDURE;  Surgeon: Shea Evans, MD;  Location: [facility];  Service: Gynecology;  Laterality: N/A;   CYSTOSCOPY N/A 08/23/2019   Procedure: CYSTOSCOPY;  Surgeon: Shea Evans, MD;  Location[facility];  Service: Gynecology;  Laterality: N/A;   FRACTURE SURGERY  08/26/1998   Left Femur   KNEE ARTHROSCOPY Left 07-06-2004  @[facility]    ORIF FEMUR FRACTURE  08/1999   RECTOCELE REPAIR N/A 08/23/2019   Procedure: POSTERIOR REPAIR (RECTOCELE)/Perineorrhaphy;  Surgeon: 08/25/2019, MD;  Location: [facility];  Service: Gynecology;  Laterality: N/A;  Requests 2 hrs.    Current Outpatient Medications   Medication Sig Dispense Refill   acetaminophen (TYLENOL) 325 MG tablet Take 2 tablets (650 mg total) by mouth every 4 (four) hours as needed for mild pain (temperature > 101.5.).     dicyclomine (BENTYL) 10 MG capsule Take 1 capsule (10 mg total) by mouth 4 (four) times daily -  before meals and at bedtime. 120 capsule 1   ibuprofen (ADVIL) 200 MG tablet Take 3 tablets (600 mg total) by mouth every 6 (six) hours as needed. 30 tablet 0   NORTREL 7/7/7 0.5/0.75/1-35 MG-MCG tablet Take 1 tablet by mouth at bedtime.      pantoprazole (PROTONIX) 40 MG tablet TAKE 1 TABLET (40 MG TOTAL) BY MOUTH DAILY BEFORE BREAKFAST. 90 tablet 3   phentermine (ADIPEX-P) 37.5 MG tablet Take 1 tablet (37.5 mg total) by mouth daily before breakfast. 30 tablet 0   valACYclovir (VALTREX) 1000 MG tablet TAKE 2 TABLETS BY MOUTH EVERY 12 HOURS FOR ONE DAY. USE AS NEEDED. 20 tablet 2   No current facility-administered medications for this visit.    Allergies as of 12/10/2020   (No Known Allergies)    Family History  Problem Relation Age of Onset   Hypertension Mother    Hypertension Father    Cancer Maternal Grandmother    Diabetes Maternal Grandmother    Cancer Maternal Grandfather    Diabetes Maternal Grandfather    Breast cancer Maternal Aunt    Colon cancer Neg Hx     Social History   Socioeconomic History   Marital status: Married    Spouse name: 02/09/2021  Number of children: 3   Years of education: Not on file   Highest education level: Associate degree: academic program  Occupational History   Occupation: Writer: [employer]    Comment: Corporate treasurer  Tobacco Use   Smoking status: Never   Smokeless tobacco: Never  Vaping Use   Vaping Use: Never used  Substance and Sexual Activity   Alcohol use: No   Drug use: No   Sexual activity: Yes    Birth control/protection: Pill  Other Topics Concern   Not on file  Social History Narrative   Not on file   Social  Determinants of Health   Financial Resource Strain: Not on file  Food Insecurity: Not on file  Transportation Needs: Not on file  Physical Activity: Not on file  Stress: Not on file  Social Connections: Not on file    Review of Systems: Gen: Denies fever, chills, anorexia. Denies fatigue, weakness, weight loss.  CV: Denies chest pain, palpitations, syncope, peripheral edema, and claudication. Resp: Denies dyspnea at rest, cough, wheezing, coughing up blood, and pleurisy. GI: Denies vomiting blood, jaundice, and fecal incontinence.   Denies dysphagia or odynophagia. Derm: Denies rash, itching, dry skin Psych: Denies depression, anxiety, memory loss, confusion. No homicidal or suicidal ideation.  Heme: Denies bruising, bleeding, and enlarged lymph nodes.  Physical Exam: There were no vitals taken for this visit. General:   Alert and oriented. No distress noted. Pleasant and cooperative.  Head:  Normocephalic and atraumatic. Eyes:  Conjuctiva clear without scleral icterus. Mouth:  Oral mucosa pink and moist. Good dentition. No lesions. Heart:  S1, S2 present without murmurs appreciated. Lungs:  Clear to auscultation bilaterally. No wheezes, rales, or rhonchi. No distress.  Abdomen:  +BS, soft, non-tender and non-distended. No rebound or guarding. No HSM or masses noted. Msk:  Symmetrical without gross deformities. Normal posture. Extremities:  Without edema. Neurologic:  Alert and  oriented x4 Psych:  Alert and cooperative. Normal mood and affect.

## 2020-12-10 ENCOUNTER — Encounter: Payer: Self-pay | Admitting: Internal Medicine

## 2020-12-10 ENCOUNTER — Ambulatory Visit: Payer: No Typology Code available for payment source | Admitting: Gastroenterology

## 2020-12-15 ENCOUNTER — Ambulatory Visit (INDEPENDENT_AMBULATORY_CARE_PROVIDER_SITE_OTHER): Payer: No Typology Code available for payment source | Admitting: Family Medicine

## 2020-12-15 ENCOUNTER — Other Ambulatory Visit (HOSPITAL_COMMUNITY): Payer: Self-pay

## 2020-12-15 ENCOUNTER — Encounter: Payer: Self-pay | Admitting: Family Medicine

## 2020-12-15 DIAGNOSIS — E669 Obesity, unspecified: Secondary | ICD-10-CM

## 2020-12-15 MED ORDER — PHENTERMINE HCL 37.5 MG PO TABS
37.5000 mg | ORAL_TABLET | Freq: Every day | ORAL | 0 refills | Status: DC
Start: 2020-12-15 — End: 2021-02-03
  Filled 2020-12-15: qty 30, 30d supply, fill #0

## 2020-12-15 NOTE — Progress Notes (Signed)
Virtual Visit via Telephone Note  I connected with Kendra Payne on 12/15/20 at 8:38 AM by telephone and verified that I am speaking with the correct person using two identifiers. Kendra Payne is currently located at work and nobody is currently with her during this visit. The provider, Gwenlyn Fudge, FNP is located in their office at time of visit.  I discussed the limitations, risks, security and privacy concerns of performing an evaluation and management service by telephone and the availability of in person appointments. I also discussed with the patient that there may be a patient responsible charge related to this service. The patient expressed understanding and agreed to proceed.  Subjective: PCP: Gwenlyn Fudge, FNP  Chief Complaint  Patient presents with   Obesity   Patient is following up on her weight. Visit had to be done virtually today as I am under the weather. Patient reports she is tolerating medication well, without side effects. She is following her weight watchers diet. She has not been able to exercise recently as she fell down the stairs at work and has been letting her leg rest/heal. She last weighed herself last week and reports a weight of 215 lbs.    ROS: Per HPI  Current Outpatient Medications:    acetaminophen (TYLENOL) 325 MG tablet, Take 2 tablets (650 mg total) by mouth every 4 (four) hours as needed for mild pain (temperature > 101.5.)., Disp: , Rfl:    dicyclomine (BENTYL) 10 MG capsule, Take 1 capsule (10 mg total) by mouth 4 (four) times daily -  before meals and at bedtime., Disp: 120 capsule, Rfl: 1   ibuprofen (ADVIL) 200 MG tablet, Take 3 tablets (600 mg total) by mouth every 6 (six) hours as needed., Disp: 30 tablet, Rfl: 0   NORTREL 7/7/7 0.5/0.75/1-35 MG-MCG tablet, Take 1 tablet by mouth at bedtime. , Disp: , Rfl:    pantoprazole (PROTONIX) 40 MG tablet, TAKE 1 TABLET (40 MG TOTAL) BY MOUTH DAILY BEFORE BREAKFAST., Disp: 90 tablet,  Rfl: 3   phentermine (ADIPEX-P) 37.5 MG tablet, Take 1 tablet (37.5 mg total) by mouth daily before breakfast., Disp: 30 tablet, Rfl: 0   valACYclovir (VALTREX) 1000 MG tablet, TAKE 2 TABLETS BY MOUTH EVERY 12 HOURS FOR ONE DAY. USE AS NEEDED., Disp: 20 tablet, Rfl: 2  No Known Allergies Past Medical History:  Diagnosis Date   GERD (gastroesophageal reflux disease)    IBS (irritable bowel syndrome)    Perforation of bladder during operative procedure 08/24/2019   While placing TVT Exact. On right lateral upper side, not involving trigone    Rectocele    SUI (stress urinary incontinence, female)    Wears contact lenses     Observations/Objective: A&O  No respiratory distress or wheezing audible over the phone Mood, judgement, and thought processes all WNL   Assessment and Plan: 1. Obesity (BMI 30-39.9) Phentermine prescription #2 written today. Starting weight: 227 lbs Today's weight: 215 lbs Weight lost since last visit 7.5 weeks ago: 12 lbs Total weight lost: 12 lbs - phentermine (ADIPEX-P) 37.5 MG tablet; Take 1 tablet (37.5 mg total) by mouth daily before breakfast.  Dispense: 30 tablet; Refill: 0   Follow Up Instructions: Return in about 4 weeks (around 01/12/2021) for weight.  I discussed the assessment and treatment plan with the patient. The patient was provided an opportunity to ask questions and all were answered. The patient agreed with the plan and demonstrated an understanding of the instructions.  The patient was advised to call back or seek an in-person evaluation if the symptoms worsen or if the condition fails to improve as anticipated.  The above assessment and management plan was discussed with the patient. The patient verbalized understanding of and has agreed to the management plan. Patient is aware to call the clinic if symptoms persist or worsen. Patient is aware when to return to the clinic for a follow-up visit. Patient educated on when it is appropriate  to go to the emergency department.   Time call ended: 8:49 AM  I provided 11 minutes of non-face-to-face time during this encounter.  Deliah Boston, MSN, APRN, FNP-C [facility] 12/15/20

## 2020-12-23 ENCOUNTER — Telehealth: Payer: No Typology Code available for payment source | Admitting: Family Medicine

## 2020-12-30 ENCOUNTER — Other Ambulatory Visit (HOSPITAL_COMMUNITY): Payer: Self-pay

## 2020-12-30 MED ORDER — NORTREL 7/7/7 0.5/0.75/1-35 MG-MCG PO TABS
1.0000 | ORAL_TABLET | Freq: Every day | ORAL | 2 refills | Status: DC
  Filled 2020-12-30: qty 84, 84d supply, fill #0
  Filled 2021-03-16: qty 84, 84d supply, fill #1
  Filled 2021-06-07: qty 84, 84d supply, fill #2

## 2020-12-31 ENCOUNTER — Other Ambulatory Visit (HOSPITAL_COMMUNITY): Payer: Self-pay

## 2021-01-12 ENCOUNTER — Ambulatory Visit: Payer: No Typology Code available for payment source | Admitting: Family Medicine

## 2021-01-19 ENCOUNTER — Ambulatory Visit: Payer: No Typology Code available for payment source | Admitting: Family Medicine

## 2021-02-03 ENCOUNTER — Other Ambulatory Visit (HOSPITAL_COMMUNITY): Payer: Self-pay

## 2021-02-03 ENCOUNTER — Encounter: Payer: Self-pay | Admitting: Family Medicine

## 2021-02-03 ENCOUNTER — Ambulatory Visit (INDEPENDENT_AMBULATORY_CARE_PROVIDER_SITE_OTHER): Payer: No Typology Code available for payment source | Admitting: Family Medicine

## 2021-02-03 VITALS — BP 112/78 | HR 81 | Temp 97.5°F | Ht 67.0 in | Wt 217.2 lb

## 2021-02-03 DIAGNOSIS — M5489 Other dorsalgia: Secondary | ICD-10-CM | POA: Diagnosis not present

## 2021-02-03 DIAGNOSIS — M549 Dorsalgia, unspecified: Secondary | ICD-10-CM

## 2021-02-03 DIAGNOSIS — E669 Obesity, unspecified: Secondary | ICD-10-CM

## 2021-02-03 MED ORDER — PHENTERMINE HCL 37.5 MG PO TABS: 37.5000 mg | ORAL_TABLET | Freq: Every day | ORAL | 0 refills | Status: DC

## 2021-02-03 MED ORDER — METHOCARBAMOL 500 MG PO TABS: 500.0000 mg | ORAL_TABLET | Freq: Three times a day (TID) | ORAL | 1 refills | Status: DC | PRN

## 2021-02-03 MED ORDER — METHYLPREDNISOLONE ACETATE 80 MG/ML IJ SUSP
80.0000 mg | Freq: Once | INTRAMUSCULAR | Status: AC
Start: 2021-02-03 — End: 2021-02-03
  Administered 2021-02-03: 80 mg via INTRAMUSCULAR

## 2021-02-03 NOTE — Patient Instructions (Signed)
Heating pad Muscle rub (Biofreeze) Lidoderm patches Muscle relaxer (Robaxin)

## 2021-02-03 NOTE — Progress Notes (Signed)
Assessment & Plan:  1. Musculoskeletal back pain - encouraged use of NSAIDs, muscle rubs, heat/ice therapy - methylPREDNISolone acetate (DEPO-MEDROL) injection 80 mg - methocarbamol (ROBAXIN) 500 MG tablet; Take 1 tablet (500 mg total) by mouth every 8 (eight) hours as needed for muscle spasms.  Dispense: 60 tablet; Refill: 1  2. Obesity (BMI 30-39.9) Phentermine prescription #3 written today. Starting weight: 227 lbs Today's weight: 217 lbs, but patient reports she is 213 lbs on her scale at home Weight lost since last visit 6 weeks ago: 2 lbs per patient's scale Total weight lost: 14 lbs - phentermine (ADIPEX-P) 37.5 MG tablet; Take 1 tablet (37.5 mg total) by mouth daily before breakfast.  Dispense: 30 tablet; Refill: 0   Follow up plan: Return in about 4 weeks (around 03/03/2021) for weight.  Floy Sabina, NP Student   I personally was present during the history, physical exam, and medical decision-making activities of this service and have verified that the service and findings are accurately documented in the nurse practitioner student's note.  Deliah Boston, MSN, APRN, FNP-C [facility]   Subjective:   Patient ID: Kendra Payne, female    DOB: February 06, 1976, 45 y.o.   MRN: [id number]  HPI: Kendra Payne is a 45 y.o. female presenting on 02/03/2021 for Back Pain (X 2 days and down bilateral legs)  Back pain: She states she has had some generalized leg achiness for a while but on Monday the pain became severe in her lower back and down her legs. She has been taking 400 mg ibuprofen BID for the pain and it is helping. She denies a specific event that exacerbated the pain. She reports generalized upper back pain as well. She states the pain is worse sitting and getting out of the car and at work.   Obesity: she has been taking Phentermine for weight loss. She reports success with this medication.   ROS: Negative unless specifically indicated above  in HPI.   Relevant past medical history reviewed and updated as indicated.   Allergies and medications reviewed and updated.   Current Outpatient Medications:    acetaminophen (TYLENOL) 325 MG tablet, Take 2 tablets (650 mg total) by mouth every 4 (four) hours as needed for mild pain (temperature > 101.5.)., Disp: , Rfl:    dicyclomine (BENTYL) 10 MG capsule, Take 1 capsule (10 mg total) by mouth 4 (four) times daily -  before meals and at bedtime., Disp: 120 capsule, Rfl: 1   ibuprofen (ADVIL) 200 MG tablet, Take 3 tablets (600 mg total) by mouth every 6 (six) hours as needed., Disp: 30 tablet, Rfl: 0   methocarbamol (ROBAXIN) 500 MG tablet, Take 1 tablet (500 mg total) by mouth every 8 (eight) hours as needed for muscle spasms., Disp: 60 tablet, Rfl: 1   norethindrone-ethinyl estradiol (NORTREL 7/7/7) 0.5/0.75/1-35 MG-MCG tablet, Take 1 tablet by mouth daily., Disp: 84 tablet, Rfl: 2   NORTREL 7/7/7 0.5/0.75/1-35 MG-MCG tablet, Take 1 tablet by mouth at bedtime. , Disp: , Rfl:    pantoprazole (PROTONIX) 40 MG tablet, TAKE 1 TABLET (40 MG TOTAL) BY MOUTH DAILY BEFORE BREAKFAST., Disp: 90 tablet, Rfl: 3   phentermine (ADIPEX-P) 37.5 MG tablet, Take 1 tablet (37.5 mg total) by mouth daily before breakfast., Disp: 30 tablet, Rfl: 0  Current Facility-Administered Medications:    methylPREDNISolone acetate (DEPO-MEDROL) injection 80 mg, 80 mg, Intramuscular, Once, Deliah Boston F, FNP  No Known Allergies  Objective:   BP 112/78  Pulse 81   Temp (!) 97.5 F (36.4 C) (Temporal)   Ht 5\' 7"  (1.702 m)   Wt 98.5 kg   BMI 34.02 kg/m    Physical Exam Vitals reviewed.  Constitutional:      General: She is not in acute distress.    Appearance: Normal appearance. She is obese. She is not ill-appearing, toxic-appearing or diaphoretic.  HENT:     Head: Normocephalic and atraumatic.     Nose: Nose normal.     Mouth/Throat:     Mouth: Mucous membranes are moist.     Pharynx: Oropharynx is  clear.  Eyes:     Extraocular Movements: Extraocular movements intact.     Conjunctiva/sclera: Conjunctivae normal.     Pupils: Pupils are equal, round, and reactive to light.  Cardiovascular:     Rate and Rhythm: Normal rate and regular rhythm.  Pulmonary:     Effort: Pulmonary effort is normal. No respiratory distress.     Breath sounds: Normal breath sounds.  Abdominal:     General: There is no distension.     Palpations: Abdomen is soft. There is no mass.  Musculoskeletal:        General: Tenderness (lower back, mid back) present. Normal range of motion.     Cervical back: Normal range of motion.  Skin:    General: Skin is warm and dry.  Neurological:     General: No focal deficit present.     Mental Status: She is alert and oriented to person, place, and time.     Motor: No weakness.     Gait: Gait normal.  Psychiatric:        Mood and Affect: Mood normal.        Behavior: Behavior normal.        Thought Content: Thought content normal.        Judgment: Judgment normal.

## 2021-02-08 ENCOUNTER — Encounter: Payer: Self-pay | Admitting: Family Medicine

## 2021-03-16 ENCOUNTER — Other Ambulatory Visit (HOSPITAL_COMMUNITY): Payer: Self-pay

## 2021-04-30 ENCOUNTER — Encounter: Payer: Self-pay | Admitting: Nurse Practitioner

## 2021-04-30 ENCOUNTER — Ambulatory Visit (INDEPENDENT_AMBULATORY_CARE_PROVIDER_SITE_OTHER): Payer: No Typology Code available for payment source | Admitting: Nurse Practitioner

## 2021-04-30 VITALS — BP 111/79 | HR 76 | Temp 98.7°F | Ht 67.0 in | Wt 216.0 lb

## 2021-04-30 DIAGNOSIS — M545 Low back pain, unspecified: Secondary | ICD-10-CM

## 2021-04-30 MED ORDER — METHOCARBAMOL 750 MG PO TABS
750.0000 mg | ORAL_TABLET | Freq: Four times a day (QID) | ORAL | 1 refills | Status: DC
Start: 2021-04-30 — End: 2021-05-19

## 2021-04-30 MED ORDER — METHYLPREDNISOLONE ACETATE 80 MG/ML IJ SUSP
80.0000 mg | Freq: Once | INTRAMUSCULAR | Status: AC
  Administered 2021-04-30: 80 mg via INTRAMUSCULAR

## 2021-04-30 NOTE — Patient Instructions (Signed)
Chronic Back Pain When back pain lasts longer than 3 months, it is called chronic back pain. Pain may get worse at certain times (flare-ups). There are things you can do at home to manage your pain. Follow these instructions at home: Pay attention to any changes in your symptoms. Take these actions to help with your pain: Managing pain and stiffness   If told, put ice on the painful area. Your doctor may tell you to use ice for 24-48 hours after the flare-up starts. To do this: Put ice in a plastic bag. Place a towel between your skin and the bag. Leave the ice on for 20 minutes, 2-3 times a day. If told, put heat on the painful area. Do this as often as told by your doctor. Use the heat source that your doctor recommends, such as a moist heat pack or a heating pad. Place a towel between your skin and the heat source. Leave the heat on for 20-30 minutes. Take off the heat if your skin turns bright red. This is especially important if you are unable to feel pain, heat, or cold. You may have a greater risk of getting burned. Soak in a warm bath. This can help relieve pain. Activity  Avoid bending and other activities that make pain worse. When standing: Keep your upper back and neck straight. Keep your shoulders pulled back. Avoid slouching. When sitting: Keep your back straight. Relax your shoulders. Do not round your shoulders or pull them backward. Do not sit or stand in one place for long periods of time. Take short rest breaks during the day. Lying down or standing is usually better than sitting. Resting can help relieve pain. When sitting or lying down for a long time, do some mild activity or stretching. This will help to prevent stiffness and pain. Get regular exercise. Ask your doctor what activities are safe for you. Do not lift anything that is heavier than 10 lb (4.5 kg) or the limit that you are told, until your doctor says that it is safe. To prevent injury when you lift  things: Bend your knees. Keep the weight close to your body. Avoid twisting. Sleep on a firm mattress. Try lying on your side with your knees slightly bent. If you lie on your back, put a pillow under your knees. Medicines Treatment may include medicines for pain and swelling taken by mouth or put on the skin, prescription pain medicine, or muscle relaxants. Take over-the-counter and prescription medicines only as told by your doctor. Ask your doctor if the medicine prescribed to you: Requires you to avoid driving or using machinery. Can cause trouble pooping (constipation). You may need to take these actions to prevent or treat trouble pooping: Drink enough fluid to keep your pee (urine) pale yellow. Take over-the-counter or prescription medicines. Eat foods that are high in fiber. These include beans, whole grains, and fresh fruits and vegetables. Limit foods that are high in fat and sugars. These include fried or sweet foods. General instructions Do not use any products that contain nicotine or tobacco, such as cigarettes, e-cigarettes, and chewing tobacco. If you need help quitting, ask your doctor. Keep all follow-up visits as told by your doctor. This is important. Contact a doctor if: Your pain does not get better with rest or medicine. Your pain gets worse, or you have new pain. You have a high fever. You lose weight very quickly. You have trouble doing your normal activities. Get help right away if: One   or both of your legs or feet feel weak. One or both of your legs or feet lose feeling (have numbness). You have trouble controlling when you poop (have a bowel movement) or pee (urinate). You have bad back pain and: You feel like you may vomit (nauseous), or you vomit. You have pain in your belly (abdomen). You have shortness of breath. You faint. Summary When back pain lasts longer than 3 months, it is called chronic back pain. Pain may get worse at certain times  (flare-ups). Use ice and heat as told by your doctor. Your doctor may tell you to use ice after flare-ups. This information is not intended to replace advice given to you by your health care provider. Make sure you discuss any questions you have with your health care provider. Document Revised: 04/03/2019 Document Reviewed: 04/03/2019 Elsevier Patient Education  2022 Elsevier Inc.  

## 2021-04-30 NOTE — Progress Notes (Signed)
Acute Office Visit  Subjective:    Patient ID: Kendra Payne, female    DOB: August 25, 1975, 46 y.o.   MRN: [id number]  Chief Complaint  Patient presents with   Back Pain    HPI Patient is in today for Back Pain  She reports recurrent back pain. There was not an injury that may have caused the pain. The most recent episode started a few months ago and is staying constant. The pain is located in the right lumbar areawithout radiation. It is described as aching, sharp, and soreness, is severe and 8/10 in intensity, occurring constantly. Symptoms are worse in the: all day  Aggravating factors: standing and walking Relieving factors: none.  She has tried application of heat and NSAIDs with little relief.   Associated symptoms: No abdominal pain No bowel incontinence  No chest pain No dysuria   No fever No headaches  No joint pains No pelvic pain  No weakness in leg  No tingling in lower extremities  No urinary incontinence No weight loss      Past Medical History:  Diagnosis Date   GERD (gastroesophageal reflux disease)    IBS (irritable bowel syndrome)    Perforation of bladder during operative procedure 08/24/2019   While placing TVT Exact. On right lateral upper side, not involving trigone    Rectocele    SUI (stress urinary incontinence, female)    Wears contact lenses     Past Surgical History:  Procedure Laterality Date   BLADDER SUSPENSION N/A 08/23/2019   Procedure: ATTEMPTED TWICE  TRANSVAGINAL TAPE (TVT) PROCEDURE;  Surgeon: Azucena Fallen, MD;  Location: [facility];  Service: Gynecology;  Laterality: N/A;   CYSTOSCOPY N/A 08/23/2019   Procedure: CYSTOSCOPY;  Surgeon: Azucena Fallen, MD;  Location: [facility];  Service: Gynecology;  Laterality: N/A;   FRACTURE SURGERY  08/26/1998   Left Femur   KNEE ARTHROSCOPY Left 07-06-2004  _0    ORIF FEMUR FRACTURE  08/1999   RECTOCELE REPAIR N/A 08/23/2019   Procedure: POSTERIOR REPAIR  (RECTOCELE)/Perineorrhaphy;  Surgeon: Azucena Fallen, MD;  Location: [facility];  Service: Gynecology;  Laterality: N/A;  Requests 2 hrs.    Family History  Problem Relation Age of Onset   Hypertension Mother    Hypertension Father    Cancer Maternal Grandmother    Diabetes Maternal Grandmother    Cancer Maternal Grandfather    Diabetes Maternal Grandfather    Breast cancer Maternal Aunt    Colon cancer Neg Hx     Social History   Socioeconomic History   Marital status: Married    Spouse name: Stage manager    Number of children: 3   Years of education: Not on file   Highest education level: Associate degree: academic program  Occupational History   Occupation: Investment banker, operational: [employer]    Comment: Conservator, museum/gallery  Tobacco Use   Smoking status: Never   Smokeless tobacco: Never  Vaping Use   Vaping Use: Never used  Substance and Sexual Activity   Alcohol use: No   Drug use: No   Sexual activity: Yes    Birth control/protection: Pill  Other Topics Concern   Not on file  Social History Narrative   Not on file   Social Determinants of Health   Financial Resource Strain: Not on file  Food Insecurity: Not on file  Transportation Needs: Not on file  Physical Activity: Not on file  Stress: Not on  file  Social Connections: Not on file  Intimate Partner Violence: Not on file    Outpatient Medications Prior to Visit  Medication Sig Dispense Refill   acetaminophen (TYLENOL) 325 MG tablet Take 2 tablets (650 mg total) by mouth every 4 (four) hours as needed for mild pain (temperature > 101.5.).     dicyclomine (BENTYL) 10 MG capsule Take 1 capsule (10 mg total) by mouth 4 (four) times daily -  before meals and at bedtime. 120 capsule 1   ibuprofen (ADVIL) 200 MG tablet Take 3 tablets (600 mg total) by mouth every 6 (six) hours as needed. 30 tablet 0   norethindrone-ethinyl estradiol (NORTREL 7/7/7) 0.5/0.75/1-35 MG-MCG tablet Take 1 tablet by  mouth daily. 84 tablet 2   NORTREL 7/7/7 0.5/0.75/1-35 MG-MCG tablet Take 1 tablet by mouth at bedtime.      phentermine (ADIPEX-P) 37.5 MG tablet Take 1 tablet (37.5 mg total) by mouth daily before breakfast. 30 tablet 0   methocarbamol (ROBAXIN) 500 MG tablet Take 1 tablet (500 mg total) by mouth every 8 (eight) hours as needed for muscle spasms. 60 tablet 1   pantoprazole (PROTONIX) 40 MG tablet TAKE 1 TABLET (40 MG TOTAL) BY MOUTH DAILY BEFORE BREAKFAST. 90 tablet 3   No facility-administered medications prior to visit.    No Known Allergies  Review of Systems  Constitutional: Negative.   HENT: Negative.    Eyes: Negative.   Respiratory: Negative.    Cardiovascular: Negative.   Gastrointestinal: Negative.   Genitourinary: Negative.   Musculoskeletal:  Positive for back pain.  Skin: Negative.  Negative for rash.  All other systems reviewed and are negative.     Objective:    Physical Exam Vitals and nursing note reviewed.  Constitutional:      Appearance: Normal appearance.  HENT:     Head: Normocephalic.     Right Ear: External ear normal.     Left Ear: External ear normal.     Mouth/Throat:     Mouth: Mucous membranes are moist.     Pharynx: Oropharynx is clear.  Eyes:     Conjunctiva/sclera: Conjunctivae normal.  Cardiovascular:     Pulses: Normal pulses.     Heart sounds: Normal heart sounds.  Pulmonary:     Effort: Pulmonary effort is normal.     Breath sounds: Normal breath sounds.  Abdominal:     General: Bowel sounds are normal.  Musculoskeletal:     Lumbar back: Tenderness present. Decreased range of motion.  Skin:    Findings: No rash.  Neurological:     Mental Status: She is alert and oriented to person, place, and time.  Psychiatric:        Behavior: Behavior normal.    BP 111/79    Pulse 76    Temp 98.7 F (37.1 C)    Ht _0  (1.702 m)    Wt 216 lb (98 kg)    LMP 04/15/2021 (Approximate)    SpO2 98%    BMI 33.83 kg/m  Wt Readings from Last  3 Encounters:  04/30/21 216 lb (98 kg)  02/03/21 217 lb 3.2 oz (98.5 kg)  10/22/20 227 lb (103 kg)    Health Maintenance Due  Topic Date Due   COVID-19 Vaccine (3 - Booster for Moderna series) 06/28/2019    There are no preventive care reminders to display for this patient.   Lab Results  Component Value Date   TSH 1.220 09/11/2020   Lab Results  Component  Value Date   WBC 6.3 09/11/2020   HGB 13.1 09/11/2020   HCT 39.3 09/11/2020   MCV 89 09/11/2020   PLT 340 09/11/2020   Lab Results  Component Value Date   NA 138 09/11/2020   K 4.6 09/11/2020   CO2 19 (L) 09/11/2020   GLUCOSE 83 09/11/2020   BUN 11 09/11/2020   CREATININE 0.70 09/11/2020   BILITOT 0.3 09/11/2020   ALKPHOS 58 09/11/2020   AST 17 09/11/2020   ALT 12 09/11/2020   PROT 7.2 09/11/2020   ALBUMIN 4.0 09/11/2020   CALCIUM 9.0 09/11/2020   ANIONGAP 9 08/20/2019   EGFR 111 09/11/2020   Lab Results  Component Value Date   CHOL 181 09/11/2020   Lab Results  Component Value Date   HDL 50 09/11/2020   Lab Results  Component Value Date   LDLCALC 118 (H) 09/11/2020   Lab Results  Component Value Date   TRIG 67 09/11/2020   Lab Results  Component Value Date   CHOLHDL 3.6 09/11/2020   No results found for: HGBA1C     Assessment & Plan:  Uncontrolled lower back pain. Continue warm compress. 80 DepoMedrol shot given in office, increased Robaxin from 5-- mg tablet to 750 mg tablet by mouth as needed. Follow up with worsening unresolved symptoms.   Problem List Items Addressed This Visit   None Visit Diagnoses     Low back pain, unspecified back pain laterality, unspecified chronicity, unspecified whether sciatica present    -  Primary   Relevant Medications   methylPREDNISolone acetate (DEPO-MEDROL) injection 80 mg (Completed)   methocarbamol (ROBAXIN-750) 750 MG tablet        Meds ordered this encounter  Medications   methylPREDNISolone acetate (DEPO-MEDROL) injection 80 mg    methocarbamol (ROBAXIN-750) 750 MG tablet    Sig: Take 1 tablet (750 mg total) by mouth 4 (four) times daily.    Dispense:  30 tablet    Refill:  1    Order Specific Question:   Supervising Provider    Answer:   Jeneen Rinks     Ivy Lynn, NP

## 2021-05-18 ENCOUNTER — Encounter: Payer: Self-pay | Admitting: Family Medicine

## 2021-05-19 ENCOUNTER — Ambulatory Visit (INDEPENDENT_AMBULATORY_CARE_PROVIDER_SITE_OTHER): Payer: No Typology Code available for payment source

## 2021-05-19 ENCOUNTER — Encounter: Payer: Self-pay | Admitting: Family Medicine

## 2021-05-19 ENCOUNTER — Ambulatory Visit (INDEPENDENT_AMBULATORY_CARE_PROVIDER_SITE_OTHER): Payer: No Typology Code available for payment source | Admitting: Family Medicine

## 2021-05-19 VITALS — BP 122/82 | HR 71 | Temp 97.8°F | Ht 67.0 in | Wt 216.2 lb

## 2021-05-19 DIAGNOSIS — M5431 Sciatica, right side: Secondary | ICD-10-CM | POA: Diagnosis not present

## 2021-05-19 MED ORDER — TIZANIDINE HCL 2 MG PO TABS: 2.0000 mg | ORAL_TABLET | Freq: Four times a day (QID) | ORAL | 0 refills | Status: DC | PRN

## 2021-05-19 MED ORDER — DICLOFENAC SODIUM 75 MG PO TBEC: 75.0000 mg | DELAYED_RELEASE_TABLET | Freq: Two times a day (BID) | ORAL | 0 refills | Status: DC

## 2021-05-19 NOTE — Progress Notes (Signed)
? ?Assessment & Plan:  ?1. Right sided sciatica ?Sciatica exercises provided for patient to complete at home.  She was referred to physical therapy.  Lumbar spine x-ray completed in the office today.  Robaxin was changed to tizanidine.  She was started on diclofenac. ?- tiZANidine (ZANAFLEX) 2 MG tablet; Take 1-2 tablets (2-4 mg total) by mouth every 6 (six) hours as needed for muscle spasms.  Dispense: 60 tablet; Refill: 0 ?- diclofenac (VOLTAREN) 75 MG EC tablet; Take 1 tablet (75 mg total) by mouth 2 (two) times daily.  Dispense: 30 tablet; Refill: 0 ?- DG Lumbar Spine 2-3 Views ?- Ambulatory referral to Physical Therapy ? ? ?Follow up plan: Return if symptoms worsen or fail to improve. ? ?Hendricks Limes, MSN, APRN, FNP-C ?[place] ? ?Subjective:  ? ?Patient ID: Kendra Payne, female    DOB: December 31, 1975, 46 y.o.   MRN: [id number] ? ?HPI: ?Kendra Payne is a 46 y.o. female presenting on 05/19/2021 for Back Pain (Patient has been seen twice for her back pain.  States that now the pain runs down her right leg and does not go across her back- x 1 week. Has numbness on her right leg and foot. ) ? ?Patient reports low back pain that was initially radiating down her left leg. This has resolved and now the pain is running from her right buttock down her right leg.  She is not currently having any low back pain.  Similar symptoms occurred 3.5 months ago which resolved with a methylprednisolone injection in office.  She was seen 3 weeks ago and given the same injection and prescribed Robaxin, which have not improved her pain at all.  Nights and early mornings are worse for the pain.  Ibuprofen does dull the pain slightly.  A heating pad and massage is somewhat helpful.  She did start doing some exercises over the weekend, that she cannot tell are helping yet. ? ? ?ROS: Negative unless specifically indicated above in HPI.  ? ?Relevant past medical history reviewed and updated as indicated.   ? ?Allergies and medications reviewed and updated. ? ? ?Current Outpatient Medications:  ?  acetaminophen (TYLENOL) 325 MG tablet, Take 2 tablets (650 mg total) by mouth every 4 (four) hours as needed for mild pain (temperature > 101.5.)., Disp: , Rfl:  ?  dicyclomine (BENTYL) 10 MG capsule, Take 1 capsule (10 mg total) by mouth 4 (four) times daily -  before meals and at bedtime., Disp: 120 capsule, Rfl: 1 ?  ibuprofen (ADVIL) 200 MG tablet, Take 3 tablets (600 mg total) by mouth every 6 (six) hours as needed., Disp: 30 tablet, Rfl: 0 ?  methocarbamol (ROBAXIN-750) 750 MG tablet, Take 1 tablet (750 mg total) by mouth 4 (four) times daily., Disp: 30 tablet, Rfl: 1 ?  norethindrone-ethinyl estradiol (NORTREL 7/7/7) 0.5/0.75/1-35 MG-MCG tablet, Take 1 tablet by mouth daily., Disp: 84 tablet, Rfl: 2 ?  NORTREL 7/7/7 0.5/0.75/1-35 MG-MCG tablet, Take 1 tablet by mouth at bedtime. , Disp: , Rfl:  ?  pantoprazole (PROTONIX) 40 MG tablet, TAKE 1 TABLET (40 MG TOTAL) BY MOUTH DAILY BEFORE BREAKFAST., Disp: 90 tablet, Rfl: 3 ?  phentermine (ADIPEX-P) 37.5 MG tablet, Take 1 tablet (37.5 mg total) by mouth daily before breakfast. (Patient not taking: Reported on 05/19/2021), Disp: 30 tablet, Rfl: 0 ? ?No Known Allergies ? ?Objective:  ? ?BP 122/82   Pulse 71   Temp 97.8 ?F (36.6 ?C) (Temporal)   Ht 5\' 7"  (1.702  m)   Wt 216 lb 3.2 oz (98.1 kg)   BMI 33.86 kg/m?   ? ?Physical Exam ?Vitals reviewed.  ?Constitutional:   ?   General: She is not in acute distress. ?   Appearance: Normal appearance. She is not ill-appearing, toxic-appearing or diaphoretic.  ?HENT:  ?   Head: Normocephalic and atraumatic.  ?Eyes:  ?   General: No scleral icterus.    ?   Right eye: No discharge.     ?   Left eye: No discharge.  ?   Conjunctiva/sclera: Conjunctivae normal.  ?Cardiovascular:  ?   Rate and Rhythm: Normal rate.  ?Pulmonary:  ?   Effort: Pulmonary effort is normal. No respiratory distress.  ?Musculoskeletal:     ?   General: Normal  range of motion.  ?   Cervical back: Normal range of motion.  ?   Lumbar back: Normal. Negative right straight leg raise test and negative left straight leg raise test.  ?   Right upper leg: Tenderness (going down back of leg) present.  ?Skin: ?   General: Skin is warm and dry.  ?   Capillary Refill: Capillary refill takes less than 2 seconds.  ?Neurological:  ?   General: No focal deficit present.  ?   Mental Status: She is alert and oriented to person, place, and time. Mental status is at baseline.  ?Psychiatric:     ?   Mood and Affect: Mood normal.     ?   Behavior: Behavior normal.     ?   Thought Content: Thought content normal.     ?   Judgment: Judgment normal.  ? ? ? ? ? ? ?

## 2021-05-22 ENCOUNTER — Other Ambulatory Visit: Payer: Self-pay

## 2021-05-22 ENCOUNTER — Ambulatory Visit
Admission: EM | Admit: 2021-05-22 | Discharge: 2021-05-22 | Disposition: A | Discharge status: Home / Self Care | Payer: No Typology Code available for payment source | Attending: Urgent Care | Admitting: Urgent Care

## 2021-05-22 DIAGNOSIS — R1031 Right lower quadrant pain: Secondary | ICD-10-CM

## 2021-05-22 MED ORDER — NAPROXEN 500 MG PO TABS: 500.0000 mg | ORAL_TABLET | Freq: Two times a day (BID) | ORAL | 0 refills | Status: DC

## 2021-05-22 NOTE — ED Triage Notes (Signed)
Pt reports right lower quadrant abdominal pain x 1 day. States pain improve when she put pressure on. Ibuprofen gives some relief. Pt wants to know id the pain is appendicitis or related to sciatic pain, as she is having x 4 weeks.   ?

## 2021-05-22 NOTE — ED Provider Notes (Signed)
?[facility] ? ? ?MRN: [id number] DOB: 02-Feb-1976 ? ?Subjective:  ? ?Kendra Payne is a 46 y.o. female presenting for 1 day history of persistent right lower groin pain.  No fever, nausea, vomiting, dysuria, hematuria, diarrhea, constipation, bloody stools.  Patient has had difficulty with sciatica as well and is wondering if this is contributing.  No rashes.  No history of diverticulosis.  She does have a history of IBS and GERD but her bowels are not bothering her at the moment. ? ?No current facility-administered medications for this encounter. ? ?Current Outpatient Medications:  ?  acetaminophen (TYLENOL) 325 MG tablet, Take 2 tablets (650 mg total) by mouth every 4 (four) hours as needed for mild pain (temperature > 101.5.)., Disp: , Rfl:  ?  diclofenac (VOLTAREN) 75 MG EC tablet, Take 1 tablet (75 mg total) by mouth 2 (two) times daily., Disp: 30 tablet, Rfl: 0 ?  dicyclomine (BENTYL) 10 MG capsule, Take 1 capsule (10 mg total) by mouth 4 (four) times daily -  before meals and at bedtime., Disp: 120 capsule, Rfl: 1 ?  ibuprofen (ADVIL) 200 MG tablet, Take 3 tablets (600 mg total) by mouth every 6 (six) hours as needed., Disp: 30 tablet, Rfl: 0 ?  norethindrone-ethinyl estradiol (NORTREL 7/7/7) 0.5/0.75/1-35 MG-MCG tablet, Take 1 tablet by mouth daily., Disp: 84 tablet, Rfl: 2 ?  pantoprazole (PROTONIX) 40 MG tablet, TAKE 1 TABLET (40 MG TOTAL) BY MOUTH DAILY BEFORE BREAKFAST., Disp: 90 tablet, Rfl: 3 ?  tiZANidine (ZANAFLEX) 2 MG tablet, Take 1-2 tablets (2-4 mg total) by mouth every 6 (six) hours as needed for muscle spasms., Disp: 60 tablet, Rfl: 0  ? ?No Known Allergies ? ?Past Medical History:  ?Diagnosis Date  ? GERD (gastroesophageal reflux disease)   ? IBS (irritable bowel syndrome)   ? Perforation of bladder during operative procedure 08/24/2019  ? While placing TVT Exact. On right lateral upper side, not involving trigone   ? Rectocele   ? SUI (stress urinary incontinence, female)    ? Wears contact lenses   ?  ? ?Past Surgical History:  ?Procedure Laterality Date  ? BLADDER SUSPENSION N/A 08/23/2019  ? Procedure: ATTEMPTED TWICE  TRANSVAGINAL TAPE (TVT) PROCEDURE;  Surgeon: Shea Evans, MD;  Location: [facility];  Service: Gynecology;  Laterality: N/A;  ? CYSTOSCOPY N/A 08/23/2019  ? Procedure: CYSTOSCOPY;  Surgeon: Shea Evans, MD;  Location: [facility];  Service: Gynecology;  Laterality: N/A;  ? FRACTURE SURGERY  08/26/1998  ? Left Femur  ? KNEE ARTHROSCOPY Left 07-06-2004  @[facility]   ? ORIF FEMUR FRACTURE  08/1999  ? RECTOCELE REPAIR N/A 08/23/2019  ? Procedure: POSTERIOR REPAIR (RECTOCELE)/Perineorrhaphy;  Surgeon: 08/25/2019, MD;  Location: [facility];  Service: Gynecology;  Laterality: N/A;  Requests 2 hrs.  ? ? ?Family History  ?Problem Relation Age of Onset  ? Hypertension Mother   ? Hypertension Father   ? Cancer Maternal Grandmother   ? Diabetes Maternal Grandmother   ? Cancer Maternal Grandfather   ? Diabetes Maternal Grandfather   ? Breast cancer Maternal Aunt   ? Colon cancer Neg Hx   ? ? ?Social History  ? ?Tobacco Use  ? Smoking status: Never  ? Smokeless tobacco: Never  ?Vaping Use  ? Vaping Use: Never used  ?Substance Use Topics  ? Alcohol use: No  ? Drug use: Never  ? ? ?ROS ? ? ?Objective:  ? ?Vitals: ?BP 122/76 (BP Location: Right Arm)  Pulse 86   Temp 98.1 ?F (36.7 ?C) (Oral)   Resp 18   LMP  (Approximate)   SpO2 98%  ? ?Physical Exam ?Constitutional:   ?   General: She is not in acute distress. ?   Appearance: Normal appearance. She is well-developed. She is not ill-appearing, toxic-appearing or diaphoretic.  ?HENT:  ?   Head: Normocephalic and atraumatic.  ?   Nose: Nose normal.  ?   Mouth/Throat:  ?   Mouth: Mucous membranes are moist.  ?   Pharynx: Oropharynx is clear.  ?Eyes:  ?   General: No scleral icterus.    ?   Right eye: No discharge.     ?   Left eye: No discharge.  ?   Extraocular Movements: Extraocular  movements intact.  ?   Conjunctiva/sclera: Conjunctivae normal.  ?Cardiovascular:  ?   Rate and Rhythm: Normal rate.  ?Pulmonary:  ?   Effort: Pulmonary effort is normal.  ?Abdominal:  ?   General: Bowel sounds are normal. There is no distension.  ?   Palpations: Abdomen is soft. There is no mass.  ?   Tenderness: There is no abdominal tenderness. There is no right CVA tenderness, left CVA tenderness, guarding or rebound.  ?Musculoskeletal:  ?     Legs: ? ?Skin: ?   General: Skin is warm and dry.  ?Neurological:  ?   General: No focal deficit present.  ?   Mental Status: She is alert and oriented to person, place, and time.  ?Psychiatric:     ?   Mood and Affect: Mood normal.     ?   Behavior: Behavior normal.     ?   Thought Content: Thought content normal.     ?   Judgment: Judgment normal.  ? ? ?Assessment and Plan :  ? ?PDMP not reviewed this encounter. ? ?1. Right groin pain   ? ?Recommended conservative management for musculoskeletal pain with naproxen instead of diclofenac.  Use tizanidine as a muscle relaxant.  Low suspicion for an acute abdomen, acute gynecologic problem.  Patient was agreeable to this treatment plan, will follow-up with her PCP and maintain strict ER precautions. Counseled patient on potential for adverse effects with medications prescribed today, patient verbalized understanding.  ?  ?Wallis Bamberg, PA-C ?05/22/21 1614 ? ?

## 2021-05-27 ENCOUNTER — Encounter: Payer: Self-pay | Admitting: Family Medicine

## 2021-06-03 ENCOUNTER — Other Ambulatory Visit (HOSPITAL_COMMUNITY): Payer: Self-pay

## 2021-06-07 ENCOUNTER — Other Ambulatory Visit: Payer: Self-pay | Admitting: Family Medicine

## 2021-06-07 ENCOUNTER — Other Ambulatory Visit (HOSPITAL_COMMUNITY): Payer: Self-pay

## 2021-06-07 DIAGNOSIS — M5431 Sciatica, right side: Secondary | ICD-10-CM

## 2021-06-07 NOTE — Telephone Encounter (Signed)
Last OV 05/19/2021. Last RF 05/19/2021 #60 no RF. Next OV none scheduled  ? ?

## 2021-06-08 ENCOUNTER — Other Ambulatory Visit: Payer: Self-pay | Admitting: Family Medicine

## 2021-06-08 ENCOUNTER — Encounter: Payer: Self-pay | Admitting: Family Medicine

## 2021-06-08 DIAGNOSIS — M5431 Sciatica, right side: Secondary | ICD-10-CM

## 2021-06-09 NOTE — Telephone Encounter (Signed)
Pt called to check on status of PCP sending in refills for her Tizanidine Rx. Says she only received a 15 day supply when she filled it last and has been spreading the medicine out to make it last but says she is having a muscle spasm flare up and really needs a refill sent in to [facility] pharmacy ASAP. ?

## 2021-06-09 NOTE — Telephone Encounter (Signed)
There is a pending mychart message for this 4/5-jhb (in [employer])  ?

## 2021-08-19 ENCOUNTER — Other Ambulatory Visit (HOSPITAL_COMMUNITY): Payer: Self-pay

## 2021-08-19 MED ORDER — NORTREL 7/7/7 0.5/0.75/1-35 MG-MCG PO TABS
1.0000 | ORAL_TABLET | Freq: Every day | ORAL | 4 refills | Status: DC
  Filled 2021-08-19: qty 84, 84d supply, fill #0
  Filled 2021-11-17: qty 84, 84d supply, fill #1
  Filled 2022-01-12 – 2022-02-09 (×2): qty 84, 84d supply, fill #2
  Filled 2022-04-29 – 2022-05-06 (×4): qty 84, 84d supply, fill #3
  Filled 2022-07-23: qty 84, 84d supply, fill #4

## 2021-10-08 ENCOUNTER — Ambulatory Visit (INDEPENDENT_AMBULATORY_CARE_PROVIDER_SITE_OTHER): Payer: No Typology Code available for payment source | Admitting: Family Medicine

## 2021-10-08 ENCOUNTER — Encounter: Payer: Self-pay | Admitting: Family Medicine

## 2021-10-08 ENCOUNTER — Other Ambulatory Visit (HOSPITAL_COMMUNITY): Payer: Self-pay

## 2021-10-08 VITALS — HR 70 | Temp 98.6°F | Ht 67.0 in | Wt 227.0 lb

## 2021-10-08 DIAGNOSIS — K219 Gastro-esophageal reflux disease without esophagitis: Secondary | ICD-10-CM | POA: Diagnosis not present

## 2021-10-08 DIAGNOSIS — E669 Obesity, unspecified: Secondary | ICD-10-CM

## 2021-10-08 DIAGNOSIS — Z0001 Encounter for general adult medical examination with abnormal findings: Secondary | ICD-10-CM

## 2021-10-08 DIAGNOSIS — Z0000 Encounter for general adult medical examination without abnormal findings: Secondary | ICD-10-CM

## 2021-10-08 MED ORDER — SEMAGLUTIDE-WEIGHT MANAGEMENT 1 MG/0.5ML ~~LOC~~ SOAJ
1.0000 mg | SUBCUTANEOUS | 0 refills | Status: AC
  Filled 2021-10-08 – 2021-12-22 (×2): qty 2, 28d supply, fill #0

## 2021-10-08 MED ORDER — SEMAGLUTIDE-WEIGHT MANAGEMENT 0.5 MG/0.5ML ~~LOC~~ SOAJ
0.5000 mg | SUBCUTANEOUS | 0 refills | Status: AC
  Filled 2021-10-08 – 2021-11-30 (×2): qty 2, 28d supply, fill #0

## 2021-10-08 MED ORDER — SEMAGLUTIDE-WEIGHT MANAGEMENT 2.4 MG/0.75ML ~~LOC~~ SOAJ
2.4000 mg | SUBCUTANEOUS | 0 refills | Status: AC
  Filled 2021-10-08 – 2022-02-23 (×2): qty 3, 28d supply, fill #0

## 2021-10-08 MED ORDER — PANTOPRAZOLE SODIUM 40 MG PO TBEC
40.0000 mg | DELAYED_RELEASE_TABLET | Freq: Two times a day (BID) | ORAL | 3 refills | Status: DC
  Filled 2021-10-08: qty 90, 45d supply, fill #0

## 2021-10-08 MED ORDER — SEMAGLUTIDE-WEIGHT MANAGEMENT 1.7 MG/0.75ML ~~LOC~~ SOAJ
1.7000 mg | SUBCUTANEOUS | 0 refills | Status: AC
  Filled 2021-10-08 – 2022-02-03 (×3): qty 3, 28d supply, fill #0

## 2021-10-08 MED ORDER — PANTOPRAZOLE SODIUM 40 MG PO TBEC
40.0000 mg | DELAYED_RELEASE_TABLET | Freq: Two times a day (BID) | ORAL | 3 refills | Status: DC
  Filled 2021-10-08: qty 180, 90d supply, fill #0

## 2021-10-08 MED ORDER — SEMAGLUTIDE-WEIGHT MANAGEMENT 0.25 MG/0.5ML ~~LOC~~ SOAJ
0.2500 mg | SUBCUTANEOUS | 0 refills | Status: AC
  Filled 2021-10-08 – 2021-10-19 (×5): qty 2, 28d supply, fill #0

## 2021-10-08 NOTE — Progress Notes (Addendum)
Assessment & Plan:  Well adult exam Discussed health benefits of physical activity, and encouraged her to engage in regular exercise appropriate for her age and condition. Preventive health education provided.   Immunization History  Administered Date(s) Administered   Influenza Inj Mdck Quad Pf 01/06/2019   Influenza-Unspecified 01/04/2018   Moderna Sars-Covid-2 Vaccination 04/02/2019, 05/03/2019   Tdap 08/09/1999, 04/24/2007, 12/25/2012   Health Maintenance  Topic Date Due   COVID-19 Vaccine (3 - Moderna series) 06/28/2019   MAMMOGRAM  08/07/2021   INFLUENZA VACCINE  10/05/2021   Hepatitis C Screening  10/22/2021 (Originally 09/29/1995)   TETANUS/TDAP  12/26/2022   PAP SMEAR-Modifier  05/29/2023   HIV Screening  Completed   HPV VACCINES  Aged Out   - CBC with Differential/Platelet - CMP14+EGFR - Lipid panel - TSH  2. Gastroesophageal reflux disease, unspecified whether esophagitis present Well controlled on current regimen.  - pantoprazole (PROTONIX) 40 MG tablet; Take 1 tablet (40 mg total) by mouth 2 (two) times daily before a meal.  Dispense: 180 tablet; Refill: 3 - CMP14+EGFR  3. Obesity (BMI 30-39.9) Continue exercise. Encouraged healthy eating. Starting YJEHUD. - CBC with Differential/Platelet - CMP14+EGFR - Lipid panel - TSH - Semaglutide-Weight Management 0.25 MG/0.5ML SOAJ; Inject 0.25 mg into the skin once a week for 28 days.  Dispense: 2 mL; Refill: 0 - Semaglutide-Weight Management 0.5 MG/0.5ML SOAJ; Inject 0.5 mg into the skin once a week for 28 days.  Dispense: 2 mL; Refill: 0 - Semaglutide-Weight Management 1 MG/0.5ML SOAJ; Inject 1 mg into the skin once a week for 28 days.  Dispense: 2 mL; Refill: 0 - Semaglutide-Weight Management 1.7 MG/0.75ML SOAJ; Inject 1.7 mg into the skin once a week for 28 days.  Dispense: 3 mL; Refill: 0 - Semaglutide-Weight Management 2.4 MG/0.75ML SOAJ; Inject 2.4 mg into the skin once a week for 28 days.  Dispense: 3 mL;  Refill: 0   Follow-up: Return in about 3 months (around 01/08/2022) for Weight.   Kendra Limes, MSN, APRN, FNP-C [facility]  Subjective:  Patient ID: Kendra Payne, female    DOB: [birth year]  Age: 46 y.o. MRN: [id number]  Patient Care Team: Loman Brooklyn, FNP as PCP - General (Family Medicine) Gala Romney Cristopher Estimable, MD as Consulting Physician (Gastroenterology) Obgyn, Erling Conte   CC:  Chief Complaint  Patient presents with   Annual Exam    [name]    HPI [name] is a 46 y.o. female who presents today for a complete physical exam. She reports consuming a general diet. Home exercise routine includes B-Shred. She generally feels well. She reports sleeping well. She does not have additional problems to discuss today.   Vision:Within last year Dental:Receives regular dental care  DEPRESSION SCREENING    10/08/2021    8:09 AM 05/19/2021    9:11 AM 04/30/2021    4:16 PM 02/03/2021    9:21 AM 10/22/2020    8:54 AM 09/11/2020   10:52 AM 09/20/2019   10:56 AM  PHQ 2/9 Scores  PHQ - 2 Score 0 0 0 0 0 0 0  PHQ- 9 Score  0 0 0 0 0      Review of Systems  Constitutional:  Negative for chills, fever, malaise/fatigue and weight loss.  HENT:  Negative for congestion, ear discharge, ear pain, nosebleeds, sinus pain, sore throat and tinnitus.   Eyes:  Negative for blurred vision, double vision, pain, discharge and redness.  Respiratory:  Negative for cough, shortness of breath  and wheezing.   Cardiovascular:  Negative for chest pain, palpitations and leg swelling.  Gastrointestinal:  Negative for abdominal pain, constipation, diarrhea, heartburn, nausea and vomiting.  Genitourinary:  Negative for dysuria, frequency and urgency.  Musculoskeletal:  Negative for myalgias.  Skin:  Negative for rash.  Neurological:  Negative for dizziness, seizures, weakness and headaches.  Psychiatric/Behavioral:  Negative for depression, substance abuse and suicidal ideas. The  patient is not nervous/anxious.      Current Outpatient Medications:    acetaminophen (TYLENOL) 325 MG tablet, Take 2 tablets (650 mg total) by mouth every 4 (four) hours as needed for mild pain (temperature > 101.5.)., Disp: , Rfl:    diclofenac (VOLTAREN) 75 MG EC tablet, Take 1 tablet (75 mg total) by mouth 2 (two) times daily., Disp: 30 tablet, Rfl: 0   dicyclomine (BENTYL) 10 MG capsule, Take 1 capsule (10 mg total) by mouth 4 (four) times daily -  before meals and at bedtime., Disp: 120 capsule, Rfl: 1   ibuprofen (ADVIL) 200 MG tablet, Take 3 tablets (600 mg total) by mouth every 6 (six) hours as needed., Disp: 30 tablet, Rfl: 0   naproxen (NAPROSYN) 500 MG tablet, Take 1 tablet (500 mg total) by mouth 2 (two) times daily with a meal., Disp: 30 tablet, Rfl: 0   norethindrone-ethinyl estradiol (NORTREL 7/7/7) 0.5/0.75/1-35 MG-MCG tablet, Take 1 tablet by mouth daily., Disp: 84 tablet, Rfl: 4   tiZANidine (ZANAFLEX) 2 MG tablet, TAKE 1 OR 2 TABLETS BY MOUTH EVERY 6 HOURS AS NEEDED FOR MUSCLE SPASMS., Disp: 120 tablet, Rfl: 0   pantoprazole (PROTONIX) 40 MG tablet, TAKE 1 TABLET (40 MG TOTAL) BY MOUTH DAILY BEFORE BREAKFAST., Disp: 90 tablet, Rfl: 3  No Known Allergies  Past Medical History:  Diagnosis Date   GERD (gastroesophageal reflux disease)    IBS (irritable bowel syndrome)    Perforation of bladder during operative procedure 08/24/2019   While placing TVT Exact. On right lateral upper side, not involving trigone    Rectocele    SUI (stress urinary incontinence, female)    Wears contact lenses     Past Surgical History:  Procedure Laterality Date   BLADDER SUSPENSION N/A 08/23/2019   Procedure: ATTEMPTED TWICE  TRANSVAGINAL TAPE (TVT) PROCEDURE;  Surgeon: Azucena Fallen, MD;  Location: [facility];  Service: Gynecology;  Laterality: N/A;   CYSTOSCOPY N/A 08/23/2019   Procedure: CYSTOSCOPY;  Surgeon: Azucena Fallen, MD;  Location: [facility];   Service: Gynecology;  Laterality: N/A;   FRACTURE SURGERY  08/26/1998   Left Femur   KNEE ARTHROSCOPY Left 07-06-2004  _0    ORIF FEMUR FRACTURE  08/1999   RECTOCELE REPAIR N/A 08/23/2019   Procedure: POSTERIOR REPAIR (RECTOCELE)/Perineorrhaphy;  Surgeon: Azucena Fallen, MD;  Location[facility];  Service: Gynecology;  Laterality: N/A;  Requests 2 hrs.    Family History  Problem Relation Age of Onset   Hypertension Mother    Hypertension Father    Cancer Maternal Grandmother    Diabetes Maternal Grandmother    Cancer Maternal Grandfather    Diabetes Maternal Grandfather    Breast cancer Maternal Aunt    Colon cancer Neg Hx     Social History   Socioeconomic History   Marital status: Married    Spouse name: [name]    Number of children: 3   Years of education: Not on file   Highest education level: Associate degree: academic program  Occupational History   Occupation: Doctor, general practice  Employer: [employer]    Comment: [place]  Tobacco Use   Smoking status: Never   Smokeless tobacco: Never  Vaping Use   Vaping Use: Never used  Substance and Sexual Activity   Alcohol use: No   Drug use: Never   Sexual activity: Yes    Birth control/protection: Pill  Other Topics Concern   Not on file  Social History Narrative   Not on file   Social Determinants of Health   Financial Resource Strain: Low Risk  (01/12/2017)   Overall Financial Resource Strain (CARDIA)    Difficulty of Paying Living Expenses: Not hard at all  Food Insecurity: No Food Insecurity (01/12/2017)   Hunger Vital Sign    Worried About Running Out of Food in the Last Year: Never true    Ran Out of Food in the Last Year: Never true  Transportation Needs: No Transportation Needs (01/12/2017)   PRAPARE - Hydrologist (Medical): No    Lack of Transportation (Non-Medical): No  Physical Activity: Inactive (01/12/2017)   Exercise Vital Sign    Days of  Exercise per Week: 0 days    Minutes of Exercise per Session: 0 min  Stress: No Stress Concern Present (01/12/2017)   [place]    Feeling of Stress : Not at all  Social Connections: Unknown (01/12/2017)   Social Connection and Isolation Panel [NHANES]    Frequency of Communication with Friends and Family: Patient refused    Frequency of Social Gatherings with Friends and Family: Patient refused    Attends Religious Services: Patient refused    Active Member of Clubs or Organizations: Patient refused    Attends Archivist Meetings: Patient refused    Marital Status: Patient refused  Intimate Partner Violence: Unknown (01/12/2017)   Humiliation, Afraid, Rape, and Kick questionnaire    Fear of Current or Ex-Partner: Patient refused    Emotionally Abused: Patient refused    Physically Abused: Patient refused    Sexually Abused: Patient refused      Objective:    Pulse 70   Temp 98.6 F (37 C)   Ht _0  (1.702 m)   Wt 227 lb (103 kg)   LMP 09/29/2021 Comment: Oral  SpO2 100%   BMI 35.55 kg/m   BP Readings from Last 3 Encounters:  05/22/21 122/76  05/19/21 122/82  04/30/21 111/79   Wt Readings from Last 3 Encounters:  10/08/21 227 lb (103 kg)  05/19/21 216 lb 3.2 oz (98.1 kg)  04/30/21 216 lb (98 kg)    Physical Exam Vitals reviewed.  Constitutional:      General: She is not in acute distress.    Appearance: Normal appearance. She is obese. She is not ill-appearing, toxic-appearing or diaphoretic.  HENT:     Head: Normocephalic and atraumatic.     Right Ear: Tympanic membrane, ear canal and external ear normal. There is no impacted cerumen.     Left Ear: Tympanic membrane, ear canal and external ear normal. There is no impacted cerumen.     Nose: Nose normal. No congestion or rhinorrhea.     Mouth/Throat:     Mouth: Mucous membranes are moist.     Pharynx: Oropharynx is clear. No  oropharyngeal exudate or posterior oropharyngeal erythema.  Eyes:     General: No scleral icterus.       Right eye: No discharge.  Left eye: No discharge.     Conjunctiva/sclera: Conjunctivae normal.     Pupils: Pupils are equal, round, and reactive to light.  Cardiovascular:     Rate and Rhythm: Normal rate and regular rhythm.     Heart sounds: Normal heart sounds. No murmur heard.    No friction rub. No gallop.  Pulmonary:     Effort: Pulmonary effort is normal. No respiratory distress.     Breath sounds: Normal breath sounds. No stridor. No wheezing, rhonchi or rales.  Abdominal:     General: Abdomen is flat. Bowel sounds are normal. There is no distension.     Palpations: Abdomen is soft. There is no hepatomegaly, splenomegaly or mass.     Tenderness: There is no abdominal tenderness. There is no guarding or rebound.     Hernia: No hernia is present.  Musculoskeletal:        General: Normal range of motion.     Cervical back: Normal range of motion and neck supple. No rigidity. No muscular tenderness.  Lymphadenopathy:     Cervical: No cervical adenopathy.  Skin:    General: Skin is warm and dry.     Capillary Refill: Capillary refill takes less than 2 seconds.  Neurological:     General: No focal deficit present.     Mental Status: She is alert and oriented to person, place, and time. Mental status is at baseline.  Psychiatric:        Mood and Affect: Mood normal.        Behavior: Behavior normal.        Thought Content: Thought content normal.        Judgment: Judgment normal.     Lab Results  Component Value Date   TSH 1.220 09/11/2020   Lab Results  Component Value Date   WBC 6.3 09/11/2020   HGB 13.1 09/11/2020   HCT 39.3 09/11/2020   MCV 89 09/11/2020   PLT 340 09/11/2020   Lab Results  Component Value Date   NA 138 09/11/2020   K 4.6 09/11/2020   CO2 19 (L) 09/11/2020   GLUCOSE 83 09/11/2020   BUN 11 09/11/2020   CREATININE 0.70 09/11/2020    BILITOT 0.3 09/11/2020   ALKPHOS 58 09/11/2020   AST 17 09/11/2020   ALT 12 09/11/2020   PROT 7.2 09/11/2020   ALBUMIN 4.0 09/11/2020   CALCIUM 9.0 09/11/2020   ANIONGAP 9 08/20/2019   EGFR 111 09/11/2020   Lab Results  Component Value Date   CHOL 181 09/11/2020   Lab Results  Component Value Date   HDL 50 09/11/2020   Lab Results  Component Value Date   LDLCALC 118 (H) 09/11/2020   Lab Results  Component Value Date   TRIG 67 09/11/2020   Lab Results  Component Value Date   CHOLHDL 3.6 09/11/2020   No results found for: "HGBA1C"

## 2021-10-08 NOTE — Addendum Note (Signed)
Addended by: Gwenlyn Fudge on: 10/08/2021 09:02 AM   Modules accepted: Orders

## 2021-10-09 LAB — CMP14+EGFR
ALT: 6 IU/L (ref 0–32)
AST: 10 IU/L (ref 0–40)
Albumin/Globulin Ratio: 1.3 (ref 1.2–2.2)
Albumin: 3.9 g/dL (ref 3.9–4.9)
Alkaline Phosphatase: 53 IU/L (ref 44–121)
BUN/Creatinine Ratio: 15 (ref 9–23)
BUN: 11 mg/dL (ref 6–24)
Bilirubin Total: 0.2 mg/dL (ref 0.0–1.2)
CO2: 18 mmol/L — ABNORMAL LOW (ref 20–29)
Calcium: 8.6 mg/dL — ABNORMAL LOW (ref 8.7–10.2)
Chloride: 105 mmol/L (ref 96–106)
Creatinine, Ser: 0.72 mg/dL (ref 0.57–1.00)
Globulin, Total: 3 g/dL (ref 1.5–4.5)
Glucose: 84 mg/dL (ref 70–99)
Potassium: 4.7 mmol/L (ref 3.5–5.2)
Sodium: 139 mmol/L (ref 134–144)
Total Protein: 6.9 g/dL (ref 6.0–8.5)
eGFR: 106 mL/min/{1.73_m2} (ref >59)

## 2021-10-09 LAB — LIPID PANEL
Chol/HDL Ratio: 3.2 ratio (ref 0.0–4.4)
Cholesterol, Total: 165 mg/dL (ref 100–199)
HDL: 52 mg/dL (ref >39)
LDL Chol Calc (NIH): 98 mg/dL (ref 0–99)
Triglycerides: 79 mg/dL (ref 0–149)
VLDL Cholesterol Cal: 15 mg/dL (ref 5–40)

## 2021-10-09 LAB — CBC WITH DIFFERENTIAL/PLATELET
Basophils Absolute: 0 10*3/uL (ref 0.0–0.2)
Basos: 1 %
EOS (ABSOLUTE): 0.2 10*3/uL (ref 0.0–0.4)
Eos: 3 %
Hematocrit: 39.1 % (ref 34.0–46.6)
Hemoglobin: 12.8 g/dL (ref 11.1–15.9)
Immature Grans (Abs): 0 10*3/uL (ref 0.0–0.1)
Immature Granulocytes: 0 %
Lymphocytes Absolute: 1.9 10*3/uL (ref 0.7–3.1)
Lymphs: 34 %
MCH: 29.2 pg (ref 26.6–33.0)
MCHC: 32.7 g/dL (ref 31.5–35.7)
MCV: 89 fL (ref 79–97)
Monocytes Absolute: 0.5 10*3/uL (ref 0.1–0.9)
Monocytes: 9 %
Neutrophils Absolute: 2.9 10*3/uL (ref 1.4–7.0)
Neutrophils: 53 %
Platelets: 314 10*3/uL (ref 150–450)
RBC: 4.38 x10E6/uL (ref 3.77–5.28)
RDW: 12.1 % (ref 11.7–15.4)
WBC: 5.4 10*3/uL (ref 3.4–10.8)

## 2021-10-09 LAB — TSH: TSH: 1.28 u[IU]/mL (ref 0.450–4.500)

## 2021-10-11 ENCOUNTER — Telehealth: Payer: Self-pay | Admitting: *Deleted

## 2021-10-11 NOTE — Telephone Encounter (Signed)
PA for Wegovy 0.25mg -In Process  (Key: [id number]) - [phone]   [employer] is reviewing your PA request. You may close this dialog, return to your dashboard, and perform other tasks.  To check for an update later, open this request again from your dashboard. If [employer] has not replied within 24 hours for urgent requests or within 48 hours for standard requests, please contact [employer] at [phone].

## 2021-10-12 ENCOUNTER — Other Ambulatory Visit (HOSPITAL_COMMUNITY): Payer: Self-pay

## 2021-10-12 NOTE — Telephone Encounter (Signed)
Approval received, pharmacy aware 

## 2021-10-13 ENCOUNTER — Other Ambulatory Visit (HOSPITAL_COMMUNITY): Payer: Self-pay

## 2021-10-19 ENCOUNTER — Other Ambulatory Visit (HOSPITAL_COMMUNITY): Payer: Self-pay

## 2021-11-15 ENCOUNTER — Other Ambulatory Visit (HOSPITAL_COMMUNITY): Payer: Self-pay

## 2021-11-17 ENCOUNTER — Other Ambulatory Visit (HOSPITAL_COMMUNITY): Payer: Self-pay

## 2021-11-18 ENCOUNTER — Other Ambulatory Visit (HOSPITAL_COMMUNITY): Payer: Self-pay

## 2021-11-18 ENCOUNTER — Encounter: Payer: Self-pay | Admitting: Family Medicine

## 2021-11-18 DIAGNOSIS — E669 Obesity, unspecified: Secondary | ICD-10-CM

## 2021-11-18 MED ORDER — WEGOVY 0.25 MG/0.5ML ~~LOC~~ SOAJ: 0.2500 mg | SUBCUTANEOUS | 0 refills | Status: DC

## 2021-11-30 ENCOUNTER — Other Ambulatory Visit (HOSPITAL_COMMUNITY): Payer: Self-pay

## 2021-12-11 ENCOUNTER — Ambulatory Visit
Admission: EM | Admit: 2021-12-11 | Discharge: 2021-12-11 | Disposition: A | Discharge status: Home / Self Care | Payer: No Typology Code available for payment source | Attending: Family Medicine | Admitting: Family Medicine

## 2021-12-11 DIAGNOSIS — M25562 Pain in left knee: Secondary | ICD-10-CM | POA: Diagnosis not present

## 2021-12-11 MED ORDER — METHYLPREDNISOLONE SODIUM SUCC 125 MG IJ SOLR
80.0000 mg | Freq: Once | INTRAMUSCULAR | Status: AC
  Administered 2021-12-11: 80 mg via INTRAMUSCULAR

## 2021-12-11 MED ORDER — TIZANIDINE HCL 4 MG PO CAPS: 4.0000 mg | ORAL_CAPSULE | Freq: Three times a day (TID) | ORAL | 0 refills | Status: DC | PRN

## 2021-12-11 NOTE — ED Triage Notes (Signed)
Pt states that she has some left knee pain. X2 days
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
## 2021-12-22 ENCOUNTER — Other Ambulatory Visit (HOSPITAL_COMMUNITY): Payer: Self-pay

## 2021-12-31 ENCOUNTER — Other Ambulatory Visit (HOSPITAL_COMMUNITY): Payer: Self-pay

## 2022-01-12 ENCOUNTER — Other Ambulatory Visit (HOSPITAL_COMMUNITY): Payer: Self-pay

## 2022-01-14 ENCOUNTER — Other Ambulatory Visit (HOSPITAL_COMMUNITY): Payer: Self-pay

## 2022-02-01 ENCOUNTER — Other Ambulatory Visit (HOSPITAL_COMMUNITY): Payer: Self-pay

## 2022-02-03 ENCOUNTER — Other Ambulatory Visit (HOSPITAL_COMMUNITY): Payer: Self-pay

## 2022-02-04 ENCOUNTER — Other Ambulatory Visit (HOSPITAL_COMMUNITY): Payer: Self-pay

## 2022-02-04 ENCOUNTER — Encounter: Payer: Self-pay | Admitting: Family Medicine

## 2022-02-04 DIAGNOSIS — Z8619 Personal history of other infectious and parasitic diseases: Secondary | ICD-10-CM

## 2022-02-04 MED ORDER — VALACYCLOVIR HCL 1 G PO TABS
1000.0000 mg | ORAL_TABLET | Freq: Two times a day (BID) | ORAL | 0 refills | Status: AC
  Filled 2022-02-04: qty 20, 10d supply, fill #0

## 2022-02-09 ENCOUNTER — Other Ambulatory Visit (HOSPITAL_COMMUNITY): Payer: Self-pay

## 2022-02-23 ENCOUNTER — Other Ambulatory Visit (HOSPITAL_COMMUNITY): Payer: Self-pay

## 2022-02-24 ENCOUNTER — Other Ambulatory Visit: Payer: Self-pay

## 2022-02-24 ENCOUNTER — Other Ambulatory Visit (HOSPITAL_COMMUNITY): Payer: Self-pay

## 2022-02-25 ENCOUNTER — Other Ambulatory Visit (HOSPITAL_COMMUNITY): Payer: Self-pay

## 2022-03-24 ENCOUNTER — Other Ambulatory Visit: Payer: Self-pay | Admitting: Family Medicine

## 2022-03-24 DIAGNOSIS — E669 Obesity, unspecified: Secondary | ICD-10-CM

## 2022-03-25 ENCOUNTER — Other Ambulatory Visit (HOSPITAL_COMMUNITY): Payer: Self-pay

## 2022-04-04 ENCOUNTER — Other Ambulatory Visit (HOSPITAL_COMMUNITY): Payer: Self-pay

## 2022-04-04 ENCOUNTER — Other Ambulatory Visit: Payer: Self-pay

## 2022-04-04 ENCOUNTER — Encounter: Payer: Self-pay | Admitting: Family Medicine

## 2022-04-04 ENCOUNTER — Ambulatory Visit (INDEPENDENT_AMBULATORY_CARE_PROVIDER_SITE_OTHER): Payer: 59 | Admitting: Family Medicine

## 2022-04-04 VITALS — BP 92/66 | HR 76 | Temp 98.0°F | Ht 67.5 in | Wt 209.2 lb

## 2022-04-04 DIAGNOSIS — K219 Gastro-esophageal reflux disease without esophagitis: Secondary | ICD-10-CM

## 2022-04-04 DIAGNOSIS — E669 Obesity, unspecified: Secondary | ICD-10-CM

## 2022-04-04 MED ORDER — ESOMEPRAZOLE MAGNESIUM 20 MG PO CPDR
20.0000 mg | DELAYED_RELEASE_CAPSULE | Freq: Every day | ORAL | 2 refills | Status: DC
  Filled 2022-04-04: qty 90, 90d supply, fill #0
  Filled 2022-05-27 – 2022-07-04 (×2): qty 90, 90d supply, fill #1
  Filled 2022-09-28: qty 90, 90d supply, fill #2

## 2022-04-04 MED ORDER — SEMAGLUTIDE-WEIGHT MANAGEMENT 2.4 MG/0.75ML ~~LOC~~ SOAJ
2.4000 mg | SUBCUTANEOUS | 6 refills | Status: DC
  Filled 2022-04-04: qty 3, 28d supply, fill #0
  Filled 2022-04-29: qty 3, 28d supply, fill #1
  Filled 2022-05-27: qty 3, 28d supply, fill #2
  Filled 2022-06-24: qty 3, 28d supply, fill #3
  Filled 2022-10-14: qty 3, 28d supply, fill #4

## 2022-04-04 NOTE — Progress Notes (Signed)
Established Patient Office Visit  Subjective   Patient ID: Kendra Payne, female    DOB: June 24, 1975  Age: 47 y.o. MRN: [id number]  Chief Complaint  Patient presents with   Obesity    HPI Kendra Payne is her for obesity follow up. She has been on [facility] for about 6 months total. She was on the starting dose for about 2 months prior to being able to titrate up due to drug shortages. She has now been on 2.4 mg weekly for the last month or so. She reports doing well on this without side effects currently. She has been doing well with a healthy diet and smaller portion sizes. She has been walking daily. She has lost 18 lbs over the last 3 months.   She was on protonix for GERD. When she started wegovy, her GERD symptoms increased. She switched to nexium at that time and has had good relief since.      ROS Negative unless specially indicated above in HPI.    Objective:     BP 92/66   Pulse 76   Temp 98 F (36.7 C) (Temporal)   Ht 5' 7.5" (1.715 m)   Wt 209 lb 4 oz (94.9 kg)   SpO2 98%   BMI 32.29 kg/m  Wt Readings from Last 3 Encounters:  04/04/22 209 lb 4 oz (94.9 kg)  12/11/21 216 lb (98 kg)  10/08/21 227 lb (103 kg)      Physical Exam Vitals and nursing note reviewed.  Constitutional:      General: She is not in acute distress.    Appearance: She is obese. She is not ill-appearing, toxic-appearing or diaphoretic.  HENT:     Head: Normocephalic and atraumatic.  Neck:     Thyroid: No thyroid mass, thyromegaly or thyroid tenderness.  Cardiovascular:     Rate and Rhythm: Normal rate and regular rhythm.     Heart sounds: No murmur heard. Pulmonary:     Effort: Pulmonary effort is normal. No respiratory distress.     Breath sounds: Normal breath sounds.  Musculoskeletal:     Cervical back: Normal range of motion and neck supple. No rigidity.     Right lower leg: No edema.     Left lower leg: No edema.  Skin:    General: Skin is warm and dry.  Neurological:      General: No focal deficit present.     Mental Status: She is alert and oriented to person, place, and time.  Psychiatric:        Mood and Affect: Mood normal.        Behavior: Behavior normal.      No results found for any visits on 04/04/22.    The 10-year ASCVD risk score (Arnett DK, et al., 2019) is: 0.3%    Assessment & Plan:   Kendra Payne was seen today for obesity.  Diagnoses and all orders for this visit:  Obesity (BMI 30-39.9) Continue wegovy. Down 18 lbs. Patient's BMI is >30 mg/m2.  Patient's current Body mass index is 32.29 kg/m.Marland Kitchen  Patient has lost and maintained a 5% body weight loss.  Patient is currently enrolled in a healthy eating plan along with encouraged exercise.   Patient does not have a personal or family history of medullary thyroid carcinoma (MTC) or Multiple Endocrine Neoplasia syndrome type 2 (MEN 2). -     Semaglutide-Weight Management 2.4 MG/0.75ML SOAJ; Inject 2.4 mg into the skin once a week.  Gastroesophageal reflux disease, unspecified  whether esophagitis present Well controlled on current regimen. Continue nexium.  -     esomeprazole (NEXIUM) 20 MG capsule; Take 1 capsule (20 mg total) by mouth daily at 12 noon.   Return in about 3 months (around 07/04/2022) for weight .   The patient indicates understanding of these issues and agrees with the plan.   Gwenlyn Perking, FNP

## 2022-04-05 ENCOUNTER — Other Ambulatory Visit: Payer: Self-pay

## 2022-04-05 ENCOUNTER — Other Ambulatory Visit (HOSPITAL_COMMUNITY): Payer: Self-pay

## 2022-04-29 ENCOUNTER — Other Ambulatory Visit: Payer: Self-pay

## 2022-04-29 ENCOUNTER — Other Ambulatory Visit (HOSPITAL_COMMUNITY): Payer: Self-pay

## 2022-05-02 ENCOUNTER — Other Ambulatory Visit (HOSPITAL_COMMUNITY): Payer: Self-pay

## 2022-05-03 ENCOUNTER — Other Ambulatory Visit (HOSPITAL_COMMUNITY): Payer: Self-pay

## 2022-05-03 ENCOUNTER — Other Ambulatory Visit: Payer: Self-pay

## 2022-05-05 ENCOUNTER — Other Ambulatory Visit (HOSPITAL_COMMUNITY): Payer: Self-pay

## 2022-05-05 ENCOUNTER — Other Ambulatory Visit: Payer: Self-pay

## 2022-05-06 ENCOUNTER — Other Ambulatory Visit: Payer: Self-pay

## 2022-05-06 ENCOUNTER — Other Ambulatory Visit (HOSPITAL_COMMUNITY): Payer: Self-pay

## 2022-05-09 ENCOUNTER — Other Ambulatory Visit (HOSPITAL_COMMUNITY): Payer: Self-pay

## 2022-05-17 ENCOUNTER — Encounter: Payer: Self-pay | Admitting: Family Medicine

## 2022-05-27 ENCOUNTER — Other Ambulatory Visit: Payer: Self-pay

## 2022-05-31 ENCOUNTER — Other Ambulatory Visit (HOSPITAL_COMMUNITY): Payer: Self-pay

## 2022-05-31 ENCOUNTER — Other Ambulatory Visit: Payer: Self-pay

## 2022-06-01 ENCOUNTER — Other Ambulatory Visit: Payer: Self-pay

## 2022-06-24 ENCOUNTER — Other Ambulatory Visit: Payer: Self-pay

## 2022-07-04 ENCOUNTER — Other Ambulatory Visit (HOSPITAL_COMMUNITY): Payer: Self-pay

## 2022-07-25 ENCOUNTER — Other Ambulatory Visit (HOSPITAL_COMMUNITY): Payer: Self-pay

## 2022-07-25 ENCOUNTER — Other Ambulatory Visit: Payer: Self-pay

## 2022-08-02 ENCOUNTER — Other Ambulatory Visit (HOSPITAL_COMMUNITY): Payer: Self-pay

## 2022-08-12 IMAGING — DX DG LUMBAR SPINE 2-3V
2 series · 2 of 2 positions shown · non-contrast
Comparison: None.

CLINICAL DATA: Right sided sciatica.

EXAM:
LUMBAR SPINE - 2-3 VIEW

[l-spine ap]
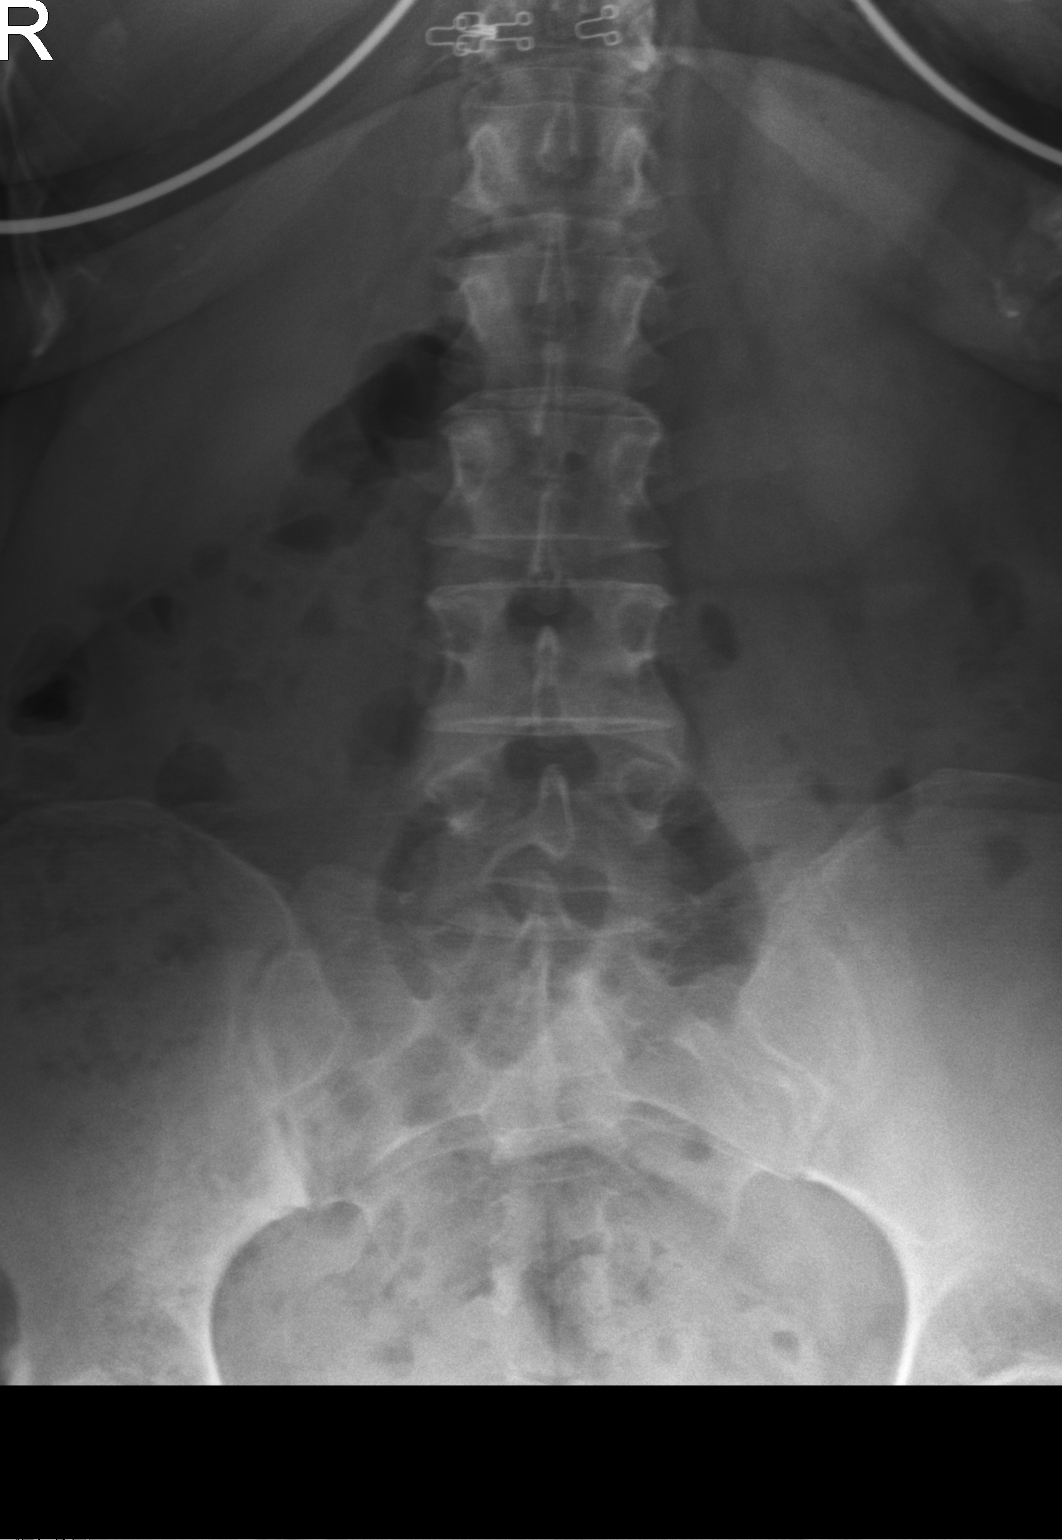

[l-spine lat]
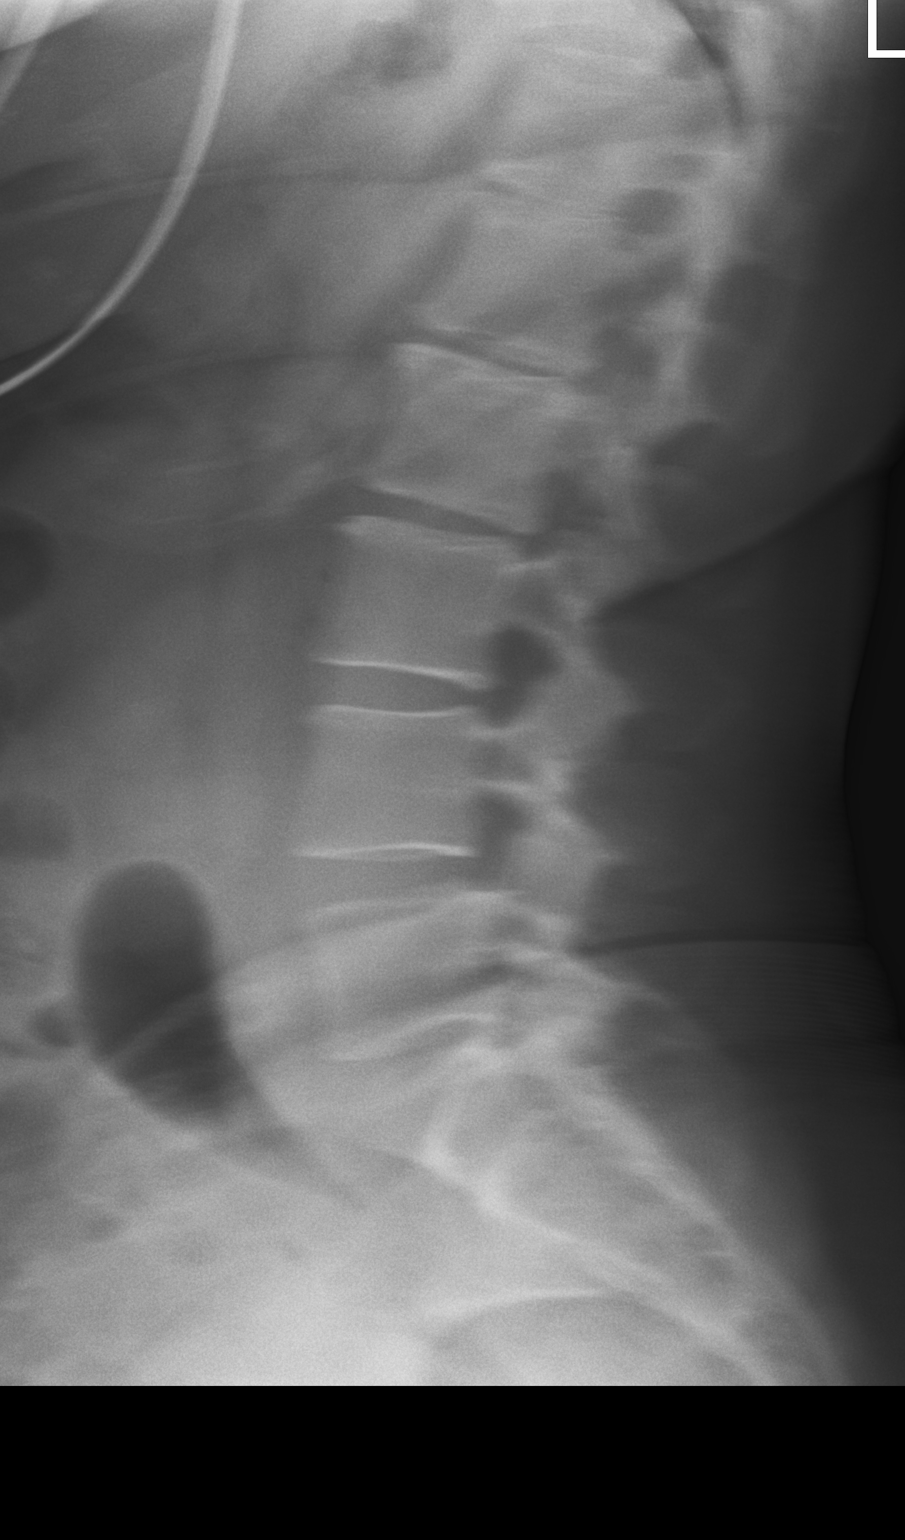

[2 of 2 positions shown; findings below may reference images not displayed]

FINDINGS: Limited two-view exam with motion artifact. No gross malalignment or
compression fracture deformity. Preserved vertebral body heights.
Facets are grossly aligned. Normal appearing pedicles and SI joints
for age. Minimal disc space narrowing at L1-2 and L2-3. Slight
sclerosis of the right SI joint inferiorly. Included abdomen
demonstrates a nonobstructive bowel gas pattern.
IMPRESSION: Minor degenerative changes as above. Limited exam with motion
artifact.

No gross acute finding by plain radiography.

## 2022-08-26 ENCOUNTER — Encounter: Payer: 59 | Admitting: Physician Assistant

## 2022-08-26 NOTE — Progress Notes (Signed)
Error   Appt for daughter This encounter was created in error - please disregard.

## 2022-08-26 NOTE — Patient Instructions (Signed)
APPLE DEARMAS  You will need to schedule the video visit for [facility] when you are together. You can do this through one of two ways (use option 2):  1) Go into your MyChart App and select the "Menu" button, then select the "Virtual Urgent Care Visit" then proceed scheduling -OR- 2) Go to [email or web address] and select "Get Started" under the Virtual Urgent Care option, select "View all options", then select the "Schedule on your Time" and proceed with scheduling as a guest.  Best Regards,  Daiva Nakayama, PA-C

## 2022-09-27 ENCOUNTER — Other Ambulatory Visit (HOSPITAL_COMMUNITY): Payer: Self-pay

## 2022-09-27 DIAGNOSIS — Z1231 Encounter for screening mammogram for malignant neoplasm of breast: Secondary | ICD-10-CM | POA: Diagnosis not present

## 2022-09-27 DIAGNOSIS — Z01419 Encounter for gynecological examination (general) (routine) without abnormal findings: Secondary | ICD-10-CM | POA: Diagnosis not present

## 2022-09-27 MED ORDER — LO LOESTRIN FE 1 MG-10 MCG / 10 MCG PO TABS
1.0000 | ORAL_TABLET | Freq: Every day | ORAL | 3 refills | Status: DC
  Filled 2022-09-27 (×2): qty 84, 84d supply, fill #0
  Filled 2022-12-15: qty 84, 84d supply, fill #1
  Filled 2023-03-09: qty 84, 84d supply, fill #2
  Filled 2023-06-01: qty 84, 84d supply, fill #3

## 2022-09-28 ENCOUNTER — Other Ambulatory Visit (HOSPITAL_COMMUNITY): Payer: Self-pay

## 2022-10-04 ENCOUNTER — Encounter: Payer: Self-pay | Admitting: Family Medicine

## 2022-10-10 ENCOUNTER — Encounter: Payer: 59 | Admitting: Family Medicine

## 2022-10-12 ENCOUNTER — Other Ambulatory Visit: Payer: Self-pay

## 2022-10-12 ENCOUNTER — Encounter: Payer: Self-pay | Admitting: Family Medicine

## 2022-10-12 ENCOUNTER — Ambulatory Visit (INDEPENDENT_AMBULATORY_CARE_PROVIDER_SITE_OTHER): Payer: 59 | Admitting: Family Medicine

## 2022-10-12 ENCOUNTER — Other Ambulatory Visit (HOSPITAL_COMMUNITY): Payer: Self-pay

## 2022-10-12 VITALS — BP 108/67 | HR 65 | Temp 97.8°F | Ht 67.5 in | Wt 207.0 lb

## 2022-10-12 DIAGNOSIS — Z8619 Personal history of other infectious and parasitic diseases: Secondary | ICD-10-CM | POA: Diagnosis not present

## 2022-10-12 DIAGNOSIS — Z1211 Encounter for screening for malignant neoplasm of colon: Secondary | ICD-10-CM

## 2022-10-12 DIAGNOSIS — E669 Obesity, unspecified: Secondary | ICD-10-CM

## 2022-10-12 DIAGNOSIS — K219 Gastro-esophageal reflux disease without esophagitis: Secondary | ICD-10-CM | POA: Diagnosis not present

## 2022-10-12 DIAGNOSIS — Z0001 Encounter for general adult medical examination with abnormal findings: Secondary | ICD-10-CM | POA: Diagnosis not present

## 2022-10-12 DIAGNOSIS — Z0000 Encounter for general adult medical examination without abnormal findings: Secondary | ICD-10-CM

## 2022-10-12 LAB — CBC WITH DIFFERENTIAL/PLATELET
Basophils Absolute: 0 10*3/uL (ref 0.0–0.2)
Basos: 1 %
EOS (ABSOLUTE): 0.1 10*3/uL (ref 0.0–0.4)
Eos: 2 %
Hematocrit: 37.8 % (ref 34.0–46.6)
Hemoglobin: 12.6 g/dL (ref 11.1–15.9)
Immature Grans (Abs): 0 10*3/uL (ref 0.0–0.1)
Immature Granulocytes: 0 %
Lymphocytes Absolute: 2 10*3/uL (ref 0.7–3.1)
Lymphs: 34 %
MCH: 29.4 pg (ref 26.6–33.0)
MCHC: 33.3 g/dL (ref 31.5–35.7)
MCV: 88 fL (ref 79–97)
Monocytes Absolute: 0.5 10*3/uL (ref 0.1–0.9)
Monocytes: 9 %
Neutrophils Absolute: 3.3 10*3/uL (ref 1.4–7.0)
Neutrophils: 54 %
Platelets: 301 10*3/uL (ref 150–450)
RBC: 4.28 x10E6/uL (ref 3.77–5.28)
RDW: 12.5 % (ref 11.7–15.4)
WBC: 6 10*3/uL (ref 3.4–10.8)

## 2022-10-12 LAB — CMP14+EGFR
ALT: 6 IU/L (ref 0–32)
AST: 12 IU/L (ref 0–40)
Albumin: 4 g/dL (ref 3.9–4.9)
Alkaline Phosphatase: 50 IU/L (ref 44–121)
BUN/Creatinine Ratio: 12 (ref 9–23)
BUN: 10 mg/dL (ref 6–24)
Bilirubin Total: 0.3 mg/dL (ref 0.0–1.2)
CO2: 19 mmol/L — ABNORMAL LOW (ref 20–29)
Calcium: 8.7 mg/dL (ref 8.7–10.2)
Chloride: 103 mmol/L (ref 96–106)
Creatinine, Ser: 0.81 mg/dL (ref 0.57–1.00)
Globulin, Total: 2.7 g/dL (ref 1.5–4.5)
Glucose: 87 mg/dL (ref 70–99)
Potassium: 4.5 mmol/L (ref 3.5–5.2)
Sodium: 136 mmol/L (ref 134–144)
Total Protein: 6.7 g/dL (ref 6.0–8.5)
eGFR: 91 mL/min/{1.73_m2} (ref >59)

## 2022-10-12 LAB — LIPID PANEL

## 2022-10-12 LAB — TSH

## 2022-10-12 LAB — BAYER DCA HB A1C WAIVED: HB A1C (BAYER DCA - WAIVED): 5.5 % (ref 4.8–5.6)

## 2022-10-12 MED ORDER — ESOMEPRAZOLE MAGNESIUM 20 MG PO CPDR
20.0000 mg | DELAYED_RELEASE_CAPSULE | Freq: Every day | ORAL | 2 refills | Status: AC
  Filled 2022-10-12: qty 90, 90d supply, fill #0

## 2022-10-12 MED ORDER — VALACYCLOVIR HCL 1 G PO TABS
2000.0000 mg | ORAL_TABLET | Freq: Two times a day (BID) | ORAL | 0 refills | Status: AC | PRN
  Filled 2022-10-12: qty 20, 5d supply, fill #0

## 2022-10-12 NOTE — Progress Notes (Signed)
Complete physical exam  Patient: Kendra Payne   DOB: Oct 13, 1975   47 y.o. Female  MRN: [id number]  Subjective:    Chief Complaint  Patient presents with   Annual Exam    Kendra Payne is a 47 y.o. female who presents today for a complete physical exam. She reports consuming a general diet. The patient does not participate in regular exercise at present. She generally feels well. She reports sleeping well. She does not have additional problems to discuss today.    Most recent fall risk assessment:    10/12/2022   10:13 AM  Fall Risk   Falls in the past year? 0     Most recent depression screenings:    10/12/2022   10:14 AM 04/04/2022   11:27 AM  PHQ 2/9 Scores  PHQ - 2 Score 0 0  PHQ- 9 Score 0 0    Vision:Within last year and Dental: No current dental problems and Receives regular dental care  Past Medical History:  Diagnosis Date   GERD (gastroesophageal reflux disease)    IBS (irritable bowel syndrome)    Perforation of bladder during operative procedure 08/24/2019   While placing TVT Exact. On right lateral upper side, not involving trigone    Rectocele    SUI (stress urinary incontinence, female)    Wears contact lenses       Patient Care Team: Gabriel Earing, FNP as PCP - General (Family Medicine) Jena Gauss, Gerrit Friends, MD as Consulting Physician (Gastroenterology) Obgyn, [facility]   Outpatient Medications Prior to Visit  Medication Sig   esomeprazole (NEXIUM) 20 MG capsule Take 1 capsule (20 mg total) by mouth daily at 12 noon.   ibuprofen (ADVIL) 200 MG tablet Take 3 tablets (600 mg total) by mouth every 6 (six) hours as needed.   Norethindrone-Ethinyl Estradiol-Fe Biphas (LO LOESTRIN FE) 1 MG-10 MCG / 10 MCG tablet Take 1 tablet by mouth daily.   Semaglutide-Weight Management 2.4 MG/0.75ML SOAJ Inject 2.4 mg into the skin once a week.   tiZANidine (ZANAFLEX) 2 MG tablet TAKE 1 OR 2 TABLETS BY MOUTH EVERY 6 HOURS AS NEEDED FOR MUSCLE SPASMS.    [DISCONTINUED] acetaminophen (TYLENOL) 325 MG tablet Take 2 tablets (650 mg total) by mouth every 4 (four) hours as needed for mild pain (temperature > 101.5.).   [DISCONTINUED] diclofenac (VOLTAREN) 75 MG EC tablet Take 1 tablet (75 mg total) by mouth 2 (two) times daily.   [DISCONTINUED] dicyclomine (BENTYL) 10 MG capsule Take 1 capsule (10 mg total) by mouth 4 (four) times daily -  before meals and at bedtime.   [DISCONTINUED] norethindrone-ethinyl estradiol (NORTREL 7/7/7) 0.5/0.75/1-35 MG-MCG tablet Take 1 tablet by mouth daily.   No facility-administered medications prior to visit.    ROS Negative unless specially indicated above in HPI.     Objective:     BP 108/67   Pulse 65   Temp 97.8 F (36.6 C) (Temporal)   Ht 5' 7.5" (1.715 m)   Wt 207 lb (93.9 kg)   SpO2 99%   BMI 31.94 kg/m  Wt Readings from Last 3 Encounters:  10/12/22 207 lb (93.9 kg)  04/04/22 209 lb 4 oz (94.9 kg)  12/11/21 216 lb (98 kg)      Physical Exam Vitals and nursing note reviewed.  Constitutional:      General: She is not in acute distress.    Appearance: Normal appearance. She is not ill-appearing.  HENT:     Head: Normocephalic.  Right Ear: Tympanic membrane, ear canal and external ear normal.     Left Ear: Tympanic membrane, ear canal and external ear normal.     Nose: Nose normal.     Mouth/Throat:     Mouth: Mucous membranes are moist.     Pharynx: Oropharynx is clear.  Eyes:     Extraocular Movements: Extraocular movements intact.     Conjunctiva/sclera: Conjunctivae normal.     Pupils: Pupils are equal, round, and reactive to light.  Neck:     Thyroid: No thyroid mass, thyromegaly or thyroid tenderness.  Cardiovascular:     Rate and Rhythm: Normal rate and regular rhythm.     Pulses: Normal pulses.     Heart sounds: Normal heart sounds. No murmur heard.    No friction rub. No gallop.  Pulmonary:     Effort: Pulmonary effort is normal.     Breath sounds: Normal breath  sounds.  Abdominal:     General: Bowel sounds are normal. There is no distension.     Palpations: Abdomen is soft. There is no mass.     Tenderness: There is no abdominal tenderness. There is no guarding.  Musculoskeletal:        General: No swelling or tenderness.     Cervical back: Normal range of motion and neck supple. No tenderness.     Right lower leg: No edema.     Left lower leg: No edema.  Skin:    General: Skin is warm and dry.     Capillary Refill: Capillary refill takes less than 2 seconds.     Findings: No lesion or rash.  Neurological:     General: No focal deficit present.     Mental Status: She is alert and oriented to person, place, and time.     Cranial Nerves: No cranial nerve deficit.     Motor: No weakness.     Gait: Gait normal.  Psychiatric:        Mood and Affect: Mood normal.        Behavior: Behavior normal.        Thought Content: Thought content normal.        Judgment: Judgment normal.      No results found for any visits on 10/12/22.     Assessment & Plan:    Routine Health Maintenance and Physical Exam  Takeshia was seen today for annual exam.  Diagnoses and all orders for this visit:  Routine general medical examination at a health care facility  Obesity (BMI 30-39.9) Diet and exercise. Fasting labs pending  -     CBC with Differential/Platelet -     CMP14+EGFR -     Lipid panel -     TSH -     Bayer DCA Hb A1c Waived  Gastroesophageal reflux disease, unspecified whether esophagitis present Well controlled on current regimen.  -     esomeprazole (NEXIUM) 20 MG capsule; Take 1 capsule (20 mg total) by mouth daily at 12 noon.  Colon cancer screening She will call her insurance regarding coverage.   History of cold sores Refill provided. No current symptoms.  -     valACYclovir (VALTREX) 1000 MG tablet; TAKE 2 TABLETS BY MOUTH EVERY 12 HOURS FOR ONE DAY. USE AS NEEDED.    Immunization History  Administered Date(s) Administered    Influenza Inj Mdck Quad Pf 01/06/2019   Influenza-Unspecified 01/04/2018   Moderna Sars-Covid-2 Vaccination 04/02/2019, 05/03/2019   Td (Adult),5 Lf Tetanus Toxid,  Preservative Free 08/09/1999   Tdap 08/09/1999, 04/24/2007, 12/25/2012    Health Maintenance  Topic Date Due   Hepatitis C Screening  04/05/2023 (Originally 09/29/1995)   COVID-19 Vaccine (3 - 2023-24 season) 04/21/2023 (Originally 11/05/2021)   INFLUENZA VACCINE  06/05/2023 (Originally 10/06/2022)   Colonoscopy  10/12/2023 (Originally 09/29/2022)   DTaP/Tdap/Td (5 - Td or Tdap) 12/26/2022   MAMMOGRAM  09/27/2023   PAP SMEAR-Modifier  08/20/2026   HIV Screening  Completed   HPV VACCINES  Aged Out    Discussed health benefits of physical activity, and encouraged her to engage in regular exercise appropriate for her age and condition.  Problem List Items Addressed This Visit       Digestive   GERD (gastroesophageal reflux disease)   Relevant Medications   esomeprazole (NEXIUM) 20 MG capsule     Other   Obesity (BMI 30-39.9)   Relevant Orders   CBC with Differential/Platelet   CMP14+EGFR   Lipid panel   TSH   Bayer DCA Hb A1c Waived   History of cold sores   Relevant Medications   valACYclovir (VALTREX) 1000 MG tablet   Other Visit Diagnoses     Routine general medical examination at a health care facility    -  Primary   Colon cancer screening          Return in about 1 year (around 10/12/2023) for [name].   The patient indicates understanding of these issues and agrees with the plan.  Gabriel Earing, FNP

## 2022-10-12 NOTE — Patient Instructions (Signed)

## 2022-10-14 ENCOUNTER — Other Ambulatory Visit (HOSPITAL_COMMUNITY): Payer: Self-pay

## 2022-10-15 ENCOUNTER — Other Ambulatory Visit (HOSPITAL_COMMUNITY): Payer: Self-pay

## 2022-10-17 ENCOUNTER — Other Ambulatory Visit: Payer: Self-pay

## 2022-12-07 ENCOUNTER — Ambulatory Visit: Payer: 59 | Admitting: Family Medicine

## 2022-12-15 ENCOUNTER — Other Ambulatory Visit: Payer: Self-pay

## 2022-12-15 ENCOUNTER — Ambulatory Visit: Payer: 59 | Admitting: Family Medicine

## 2022-12-16 ENCOUNTER — Ambulatory Visit: Payer: 59 | Admitting: Family Medicine

## 2022-12-22 ENCOUNTER — Encounter: Payer: Self-pay | Admitting: Family Medicine

## 2023-02-03 ENCOUNTER — Telehealth: Payer: 59 | Admitting: Physician Assistant

## 2023-02-03 DIAGNOSIS — J208 Acute bronchitis due to other specified organisms: Secondary | ICD-10-CM | POA: Diagnosis not present

## 2023-02-03 DIAGNOSIS — B9689 Other specified bacterial agents as the cause of diseases classified elsewhere: Secondary | ICD-10-CM | POA: Diagnosis not present

## 2023-02-03 MED ORDER — AZITHROMYCIN 250 MG PO TABS: ORAL_TABLET | ORAL | 0 refills | Status: AC

## 2023-02-03 MED ORDER — PROMETHAZINE-DM 6.25-15 MG/5ML PO SYRP: 5.0000 mL | ORAL_SOLUTION | Freq: Four times a day (QID) | ORAL | 0 refills | Status: DC | PRN

## 2023-02-03 MED ORDER — PREDNISONE 20 MG PO TABS: 40.0000 mg | ORAL_TABLET | Freq: Every day | ORAL | 0 refills | Status: DC

## 2023-02-03 NOTE — Patient Instructions (Signed)
Wilfred Lacy, thank you for joining Margaretann Loveless, PA-C for today's virtual visit.  While this provider is not your primary care provider (PCP), if your PCP is located in our provider database this encounter information will be shared with them immediately following your visit.   A [facility] MyChart account gives you access to today's visit and all your visits, tests, and labs performed at [facility] " click here if you don't have a [facility] MyChart account or go to [email or web address]  Consent: (Patient) Kendra Payne provided verbal consent for this virtual visit at the beginning of the encounter.  Current Medications:  Current Outpatient Medications:    azithromycin (ZITHROMAX) 250 MG tablet, Take 2 tablets on day 1, then 1 tablet daily on days 2 through 5, Disp: 6 tablet, Rfl: 0   esomeprazole (NEXIUM) 20 MG capsule, Take 1 capsule (20 mg total) by mouth daily at 12 noon., Disp: 90 capsule, Rfl: 2   ibuprofen (ADVIL) 200 MG tablet, Take 3 tablets (600 mg total) by mouth every 6 (six) hours as needed., Disp: 30 tablet, Rfl: 0   Norethindrone-Ethinyl Estradiol-Fe Biphas (LO LOESTRIN FE) 1 MG-10 MCG / 10 MCG tablet, Take 1 tablet by mouth daily., Disp: 84 tablet, Rfl: 3   predniSONE (DELTASONE) 20 MG tablet, Take 2 tablets (40 mg total) by mouth daily with breakfast., Disp: 10 tablet, Rfl: 0   promethazine-dextromethorphan (PROMETHAZINE-DM) 6.25-15 MG/5ML syrup, Take 5 mLs by mouth 4 (four) times daily as needed., Disp: 118 mL, Rfl: 0   Semaglutide-Weight Management 2.4 MG/0.75ML SOAJ, Inject 2.4 mg into the skin once a week., Disp: 3 mL, Rfl: 6   tiZANidine (ZANAFLEX) 2 MG tablet, TAKE 1 OR 2 TABLETS BY MOUTH EVERY 6 HOURS AS NEEDED FOR MUSCLE SPASMS., Disp: 120 tablet, Rfl: 0   valACYclovir (VALTREX) 1000 MG tablet, Take 2 tablets (2,000 mg total) by mouth every 12 (twelve) hours for 1 day as needed., Disp: 20 tablet, Rfl: 0   Medications ordered in  this encounter:  Meds ordered this encounter  Medications   azithromycin (ZITHROMAX) 250 MG tablet    Sig: Take 2 tablets on day 1, then 1 tablet daily on days 2 through 5    Dispense:  6 tablet    Refill:  0    Order Specific Question:   Supervising Provider    Answer:   Merrilee Jansky [[id number]]   predniSONE (DELTASONE) 20 MG tablet    Sig: Take 2 tablets (40 mg total) by mouth daily with breakfast.    Dispense:  10 tablet    Refill:  0    Order Specific Question:   Supervising Provider    Answer:   Merrilee Jansky [[id number]]   promethazine-dextromethorphan (PROMETHAZINE-DM) 6.25-15 MG/5ML syrup    Sig: Take 5 mLs by mouth 4 (four) times daily as needed.    Dispense:  118 mL    Refill:  0    Order Specific Question:   Supervising Provider    Answer:   Merrilee Jansky [[id number]]     *If you need refills on other medications prior to your next appointment, please contact your pharmacy*  Follow-Up: Call back or seek an in-person evaluation if the symptoms worsen or if the condition fails to improve as anticipated.  [facility] [phone]  Other Instructions Acute Bronchitis, Adult  Acute bronchitis is sudden inflammation of the main airways (bronchi) that come off the windpipe (trachea) in the lungs. The  swelling causes the airways to get smaller and make more mucus than normal. This can make it hard to breathe and can cause coughing or noisy breathing (wheezing). Acute bronchitis may last several weeks. The cough may last longer. Allergies, asthma, and exposure to smoke may make the condition worse. What are the causes? This condition can be caused by germs and by substances that irritate the lungs, including: Cold and flu viruses. The most common cause of this condition is the virus that causes the common cold. Bacteria. This is less common. Breathing in substances that irritate the lungs, including: Smoke from cigarettes and other forms of tobacco. Dust  and pollen. Fumes from household cleaning products, gases, or burned fuel. Indoor or outdoor air pollution. What increases the risk? The following factors may make you more likely to develop this condition: A weak body's defense system, also called the immune system. A condition that affects your lungs and breathing, such as asthma. What are the signs or symptoms? Common symptoms of this condition include: Coughing. This may bring up clear, yellow, or green mucus from your lungs (sputum). Wheezing. Runny or stuffy nose. Having too much mucus in your lungs (chest congestion). Shortness of breath. Aches and pains, including sore throat or chest. How is this diagnosed? This condition is usually diagnosed based on: Your symptoms and medical history. A physical exam. You may also have other tests, including tests to rule out other conditions, such as pneumonia. These tests include: A test of lung function. Test of a mucus sample to look for the presence of bacteria. Tests to check the oxygen level in your blood. Blood tests. Chest X-ray. How is this treated? Most cases of acute bronchitis clear up over time without treatment. Your health care provider may recommend: Drinking more fluids to help thin your mucus so it is easier to cough up. Taking inhaled medicine (inhaler) to improve air flow in and out of your lungs. Using a vaporizer or a humidifier. These are machines that add water to the air to help you breathe better. Taking a medicine that thins mucus and clears congestion (expectorant). Taking a medicine that prevents or stops coughing (cough suppressant). It is not common to take an antibiotic medicine for this condition. Follow these instructions at home:  Take over-the-counter and prescription medicines only as told by your health care provider. Use an inhaler, vaporizer, or humidifier as told by your health care provider. Take two teaspoons (10 mL) of honey at bedtime to  lessen coughing at night. Drink enough fluid to keep your urine pale yellow. Do not use any products that contain nicotine or tobacco. These products include cigarettes, chewing tobacco, and vaping devices, such as e-cigarettes. If you need help quitting, ask your health care provider. Get plenty of rest. Return to your normal activities as told by your health care provider. Ask your health care provider what activities are safe for you. Keep all follow-up visits. This is important. How is this prevented? To lower your risk of getting this condition again: Wash your hands often with soap and water for at least 20 seconds. If soap and water are not available, use hand sanitizer. Avoid contact with people who have cold symptoms. Try not to touch your mouth, nose, or eyes with your hands. Avoid breathing in smoke or chemical fumes. Breathing smoke or chemical fumes will make your condition worse. Get the flu shot every year. Contact a health care provider if: Your symptoms do not improve after 2 weeks.  You have trouble coughing up the mucus. Your cough keeps you awake at night. You have a fever. Get help right away if you: Cough up blood. Feel pain in your chest. Have severe shortness of breath. Faint or keep feeling like you are going to faint. Have a severe headache. Have a fever or chills that get worse. These symptoms may represent a serious problem that is an emergency. Do not wait to see if the symptoms will go away. Get medical help right away. Call your local emergency services (911 in the [place] Do not drive yourself to the hospital. Summary Acute bronchitis is inflammation of the main airways (bronchi) that come off the windpipe (trachea) in the lungs. The swelling causes the airways to get smaller and make more mucus than normal. Drinking more fluids can help thin your mucus so it is easier to cough up. Take over-the-counter and prescription medicines only as told by your health  care provider. Do not use any products that contain nicotine or tobacco. These products include cigarettes, chewing tobacco, and vaping devices, such as e-cigarettes. If you need help quitting, ask your health care provider. Contact a health care provider if your symptoms do not improve after 2 weeks. This information is not intended to replace advice given to you by your health care provider. Make sure you discuss any questions you have with your health care provider. Document Revised: 06/03/2021 Document Reviewed: 06/24/2020 Elsevier Patient Education  2024 Elsevier Inc.    If you have been instructed to have an in-person evaluation today at a local Urgent Care facility, please use the link below. It will take you to a list of all of our available [facility], including address, phone number and hours of operation. Please do not delay care.  [facility]  If you or a family member do not have a primary care provider, use the link below to schedule a visit and establish care. When you choose a [facility] primary care physician or advanced practice provider, you gain a long-term partner in health. Find a Primary Care Provider  Learn more about [facility]'s in-office and virtual care options: [facility] Now

## 2023-02-03 NOTE — Progress Notes (Signed)
Virtual Visit Consent   Kendra Payne, you are scheduled for a virtual visit with a [facility] provider today. Just as with appointments in the office, your consent must be obtained to participate. Your consent will be active for this visit and any virtual visit you may have with one of our providers in the next 365 days. If you have a MyChart account, a copy of this consent can be sent to you electronically.  As this is a virtual visit, video technology does not allow for your provider to perform a traditional examination. This may limit your provider's ability to fully assess your condition. If your provider identifies any concerns that need to be evaluated in person or the need to arrange testing (such as labs, EKG, etc.), we will make arrangements to do so. Although advances in technology are sophisticated, we cannot ensure that it will always work on either your end or our end. If the connection with a video visit is poor, the visit may have to be switched to a telephone visit. With either a video or telephone visit, we are not always able to ensure that we have a secure connection.  By engaging in this virtual visit, you consent to the provision of healthcare and authorize for your insurance to be billed (if applicable) for the services provided during this visit. Depending on your insurance coverage, you may receive a charge related to this service.  I need to obtain your verbal consent now. Are you willing to proceed with your visit today? Kendra Payne has provided verbal consent on 02/03/2023 for a virtual visit (video or telephone). Margaretann Loveless, PA-C  Date: 02/03/2023 12:36 PM  Virtual Visit via Video Note   I, Margaretann Loveless, connected with  Kendra Payne  ([id number], 1941/06/12) on 02/03/23 at 12:30 PM EST by a video-enabled telemedicine application and verified that I am speaking with the correct person using two identifiers.  Location: Patient: Virtual Visit  Location Patient: Home Provider: Virtual Visit Location Provider: Home Office   I discussed the limitations of evaluation and management by telemedicine and the availability of in person appointments. The patient expressed understanding and agreed to proceed.    History of Present Illness: Kendra Payne is a 47 y.o. who identifies as a female who was assigned female at birth, and is being seen today for cough and congestion.  HPI: Cough This is a new problem. The current episode started 1 to 4 weeks ago (one week). The problem has been gradually worsening. The problem occurs every few minutes. The cough is Non-productive. Associated symptoms include chest pain (tightness and burning with cough), chills (one day), ear congestion, headaches (last night), nasal congestion and a sore throat. Pertinent negatives include no ear pain, fever, myalgias, postnasal drip, rhinorrhea, shortness of breath or wheezing. Associated symptoms comments: Hoarse voice. The symptoms are aggravated by lying down and cold air. Treatments tried: mucinex, nyquil, benzonatate, ibuprofen. The treatment provided no relief. Her past medical history is significant for asthma (as a kid, out grew in middle school) and bronchitis. There is no history of environmental allergies or pneumonia.     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   GERD (gastroesophageal reflux disease) 04/03/2019   Family history of breast cancer 01/12/2017   Obesity (BMI 30-39.9) 10/01/2007    Allergies: No Known Allergies Medications:  Current Outpatient Medications:    azithromycin (ZITHROMAX) 250 MG  tablet, Take 2 tablets on day 1, then 1 tablet daily on days 2 through 5, Disp: 6 tablet, Rfl: 0   esomeprazole (NEXIUM) 20 MG capsule, Take 1 capsule (20 mg total) by mouth daily at 12 noon., Disp: 90 capsule, Rfl: 2   ibuprofen (ADVIL) 200 MG tablet, Take 3 tablets (600 mg total) by mouth  every 6 (six) hours as needed., Disp: 30 tablet, Rfl: 0   Norethindrone-Ethinyl Estradiol-Fe Biphas (LO LOESTRIN FE) 1 MG-10 MCG / 10 MCG tablet, Take 1 tablet by mouth daily., Disp: 84 tablet, Rfl: 3   predniSONE (DELTASONE) 20 MG tablet, Take 2 tablets (40 mg total) by mouth daily with breakfast., Disp: 10 tablet, Rfl: 0   promethazine-dextromethorphan (PROMETHAZINE-DM) 6.25-15 MG/5ML syrup, Take 5 mLs by mouth 4 (four) times daily as needed., Disp: 118 mL, Rfl: 0   Semaglutide-Weight Management 2.4 MG/0.75ML SOAJ, Inject 2.4 mg into the skin once a week., Disp: 3 mL, Rfl: 6   tiZANidine (ZANAFLEX) 2 MG tablet, TAKE 1 OR 2 TABLETS BY MOUTH EVERY 6 HOURS AS NEEDED FOR MUSCLE SPASMS., Disp: 120 tablet, Rfl: 0   valACYclovir (VALTREX) 1000 MG tablet, Take 2 tablets (2,000 mg total) by mouth every 12 (twelve) hours for 1 day as needed., Disp: 20 tablet, Rfl: 0  Observations/Objective: Patient is well-developed, well-nourished in no acute distress.  Resting comfortably at home.  Head is normocephalic, atraumatic.  No labored breathing.  Speech is clear and coherent with logical content.  Patient is alert and oriented at baseline.    Assessment and Plan: 1. Acute bacterial bronchitis - azithromycin (ZITHROMAX) 250 MG tablet; Take 2 tablets on day 1, then 1 tablet daily on days 2 through 5  Dispense: 6 tablet; Refill: 0 - predniSONE (DELTASONE) 20 MG tablet; Take 2 tablets (40 mg total) by mouth daily with breakfast.  Dispense: 10 tablet; Refill: 0 - promethazine-dextromethorphan (PROMETHAZINE-DM) 6.25-15 MG/5ML syrup; Take 5 mLs by mouth 4 (four) times daily as needed.  Dispense: 118 mL; Refill: 0  - Worsening over a week despite OTC medications - Will treat with Z-pack, Prednisone, and Promethazine DM - Can continue Mucinex (PLAIN) - Has Tessalon at home she can use as well - Tylenol and Ibuprofen can be used and alternated as needed for body aches and fevers - Push fluids.  - Rest.  -  Steam and humidifier can help - Seek in person evaluation if worsening or symptoms fail to improve    Follow Up Instructions: I discussed the assessment and treatment plan with the patient. The patient was provided an opportunity to ask questions and all were answered. The patient agreed with the plan and demonstrated an understanding of the instructions.  A copy of instructions were sent to the patient via MyChart unless otherwise noted below.    The patient was advised to call back or seek an in-person evaluation if the symptoms worsen or if the condition fails to improve as anticipated.    Margaretann Loveless, PA-C

## 2023-02-27 ENCOUNTER — Telehealth: Payer: 59 | Admitting: Physician Assistant

## 2023-02-27 DIAGNOSIS — B3731 Acute candidiasis of vulva and vagina: Secondary | ICD-10-CM

## 2023-02-28 MED ORDER — FLUCONAZOLE 150 MG PO TABS: 150.0000 mg | ORAL_TABLET | Freq: Every day | ORAL | 0 refills | Status: DC

## 2023-02-28 NOTE — Progress Notes (Signed)
I have spent 5 minutes in review of e-visit questionnaire, review and updating patient chart, medical decision making and response to patient.   Mia Milan Cody Jacklynn Dehaas, PA-C    

## 2023-02-28 NOTE — Progress Notes (Signed)

## 2023-03-10 ENCOUNTER — Other Ambulatory Visit (HOSPITAL_COMMUNITY): Payer: Self-pay

## 2023-03-15 DIAGNOSIS — N915 Oligomenorrhea, unspecified: Secondary | ICD-10-CM | POA: Diagnosis not present

## 2023-05-17 ENCOUNTER — Other Ambulatory Visit (HOSPITAL_COMMUNITY): Payer: Self-pay

## 2023-05-17 DIAGNOSIS — Z6835 Body mass index (BMI) 35.0-35.9, adult: Secondary | ICD-10-CM | POA: Diagnosis not present

## 2023-05-17 DIAGNOSIS — N915 Oligomenorrhea, unspecified: Secondary | ICD-10-CM | POA: Diagnosis not present

## 2023-05-17 MED ORDER — PHENTERMINE HCL 37.5 MG PO TABS
37.5000 mg | ORAL_TABLET | Freq: Every day | ORAL | 1 refills | Status: DC
  Filled 2023-05-17: qty 30, 30d supply, fill #0
  Filled 2023-07-11: qty 30, 30d supply, fill #1

## 2023-06-01 ENCOUNTER — Other Ambulatory Visit (HOSPITAL_COMMUNITY): Payer: Self-pay

## 2023-07-11 ENCOUNTER — Other Ambulatory Visit (HOSPITAL_COMMUNITY): Payer: Self-pay

## 2023-08-02 DIAGNOSIS — H5213 Myopia, bilateral: Secondary | ICD-10-CM | POA: Diagnosis not present

## 2023-08-02 DIAGNOSIS — H35463 Secondary vitreoretinal degeneration, bilateral: Secondary | ICD-10-CM | POA: Diagnosis not present

## 2023-08-02 DIAGNOSIS — H52223 Regular astigmatism, bilateral: Secondary | ICD-10-CM | POA: Diagnosis not present

## 2023-08-02 DIAGNOSIS — H524 Presbyopia: Secondary | ICD-10-CM | POA: Diagnosis not present

## 2023-08-17 ENCOUNTER — Other Ambulatory Visit: Payer: Self-pay

## 2023-08-17 ENCOUNTER — Other Ambulatory Visit (HOSPITAL_COMMUNITY): Payer: Self-pay

## 2023-08-17 DIAGNOSIS — Z6835 Body mass index (BMI) 35.0-35.9, adult: Secondary | ICD-10-CM | POA: Diagnosis not present

## 2023-08-17 DIAGNOSIS — E669 Obesity, unspecified: Secondary | ICD-10-CM | POA: Diagnosis not present

## 2023-08-17 DIAGNOSIS — Z713 Dietary counseling and surveillance: Secondary | ICD-10-CM | POA: Diagnosis not present

## 2023-08-17 MED ORDER — PHENTERMINE HCL 37.5 MG PO TABS
37.5000 mg | ORAL_TABLET | Freq: Every day | ORAL | 1 refills | Status: DC
  Filled 2023-08-17: qty 30, 30d supply, fill #0
  Filled 2023-09-23: qty 30, 30d supply, fill #1
  Filled 2023-09-25: qty 30, 30d supply, fill #0

## 2023-08-18 ENCOUNTER — Other Ambulatory Visit (HOSPITAL_COMMUNITY): Payer: Self-pay

## 2023-08-21 ENCOUNTER — Other Ambulatory Visit (HOSPITAL_COMMUNITY): Payer: Self-pay

## 2023-08-24 ENCOUNTER — Other Ambulatory Visit (HOSPITAL_COMMUNITY): Payer: Self-pay

## 2023-08-28 ENCOUNTER — Ambulatory Visit (INDEPENDENT_AMBULATORY_CARE_PROVIDER_SITE_OTHER): Payer: Self-pay | Admitting: Family Medicine

## 2023-08-28 ENCOUNTER — Encounter: Payer: Self-pay | Admitting: Family Medicine

## 2023-08-28 VITALS — BP 114/74 | HR 75 | Temp 97.9°F | Ht 67.5 in | Wt 218.8 lb

## 2023-08-28 DIAGNOSIS — Z23 Encounter for immunization: Secondary | ICD-10-CM

## 2023-08-28 DIAGNOSIS — M5441 Lumbago with sciatica, right side: Secondary | ICD-10-CM | POA: Diagnosis not present

## 2023-08-28 DIAGNOSIS — Z0001 Encounter for general adult medical examination with abnormal findings: Secondary | ICD-10-CM

## 2023-08-28 DIAGNOSIS — Z6833 Body mass index (BMI) 33.0-33.9, adult: Secondary | ICD-10-CM

## 2023-08-28 DIAGNOSIS — Z13228 Encounter for screening for other metabolic disorders: Secondary | ICD-10-CM | POA: Diagnosis not present

## 2023-08-28 DIAGNOSIS — E66811 Obesity, class 1: Secondary | ICD-10-CM

## 2023-08-28 DIAGNOSIS — Z13220 Encounter for screening for lipoid disorders: Secondary | ICD-10-CM | POA: Diagnosis not present

## 2023-08-28 DIAGNOSIS — Z1159 Encounter for screening for other viral diseases: Secondary | ICD-10-CM

## 2023-08-28 DIAGNOSIS — E6609 Other obesity due to excess calories: Secondary | ICD-10-CM | POA: Diagnosis not present

## 2023-08-28 DIAGNOSIS — Z0000 Encounter for general adult medical examination without abnormal findings: Secondary | ICD-10-CM

## 2023-08-28 DIAGNOSIS — Z130 Encounter for screening for diseases of the blood and blood-forming organs and certain disorders involving the immune mechanism: Secondary | ICD-10-CM | POA: Diagnosis not present

## 2023-08-28 DIAGNOSIS — Z1329 Encounter for screening for other suspected endocrine disorder: Secondary | ICD-10-CM | POA: Diagnosis not present

## 2023-08-28 MED ORDER — NAPROXEN 500 MG PO TABS: 500.0000 mg | ORAL_TABLET | Freq: Two times a day (BID) | ORAL | 0 refills | Status: DC

## 2023-08-28 MED ORDER — METHYLPREDNISOLONE ACETATE 80 MG/ML IJ SUSP
80.0000 mg | Freq: Once | INTRAMUSCULAR | Status: AC
  Administered 2023-08-28: 80 mg via INTRAMUSCULAR

## 2023-08-28 NOTE — Patient Instructions (Signed)

## 2023-08-28 NOTE — Progress Notes (Signed)
 Complete physical exam  Patient: Kendra Payne   DOB: February 16, 1976   47 y.o. Female  MRN: [id number]  Subjective:    Chief Complaint  Patient presents with   Annual Exam    Kendra Payne is a 48 y.o. female who presents today for a complete physical exam. She reports consuming a general diet. The patient does not participate in regular exercise at present. She generally feels well. She reports sleeping well. She does have additional problems to discuss today.   Right lower back pain x 2 days. Doesn't radiate. No numbness or tingling. Started after bending over to use the dust pain. Aggravated by bending, twisting, sitting. She has tried tizanidine , biofreeze, and ibuprofen  with mild relief. Denies urinary symptoms, saddle anesthesia, changes in bowel or bladder control. In the past, she has responded well to steroid injections.     Most recent fall risk assessment:    08/28/2023    8:11 AM  Fall Risk   Falls in the past year? 0     Most recent depression screenings:    08/28/2023    8:11 AM 10/12/2022   10:14 AM  PHQ 2/9 Scores  PHQ - 2 Score 0 0  PHQ- 9 Score 1 0    Vision:Within last year and Dental: No current dental problems and Receives regular dental care    Patient Care Team: Joesph Annabella HERO, FNP as PCP - General (Family Medicine) Shaaron, Lamar HERO, MD as Consulting Physician (Gastroenterology) Obgyn, Anna   Outpatient Medications Prior to Visit  Medication Sig   esomeprazole  (NEXIUM ) 20 MG capsule Take 1 capsule (20 mg total) by mouth daily at 12 noon.   ibuprofen  (ADVIL ) 200 MG tablet Take 3 tablets (600 mg total) by mouth every 6 (six) hours as needed.   Norethindrone -Ethinyl Estradiol -Fe Biphas (LO LOESTRIN FE ) 1 MG-10 MCG / 10 MCG tablet Take 1 tablet by mouth daily.   phentermine  (ADIPEX-P ) 37.5 MG tablet Take 1 tablet (37.5 mg total) by mouth daily.   tiZANidine  (ZANAFLEX ) 2 MG tablet TAKE 1 OR 2 TABLETS BY MOUTH EVERY 6 HOURS AS NEEDED FOR  MUSCLE SPASMS.   valACYclovir  (VALTREX ) 1000 MG tablet Take 2 tablets (2,000 mg total) by mouth every 12 (twelve) hours for 1 day as needed.   [DISCONTINUED] fluconazole  (DIFLUCAN ) 150 MG tablet Take 1 tablet (150 mg total) by mouth daily.   [DISCONTINUED] predniSONE  (DELTASONE ) 20 MG tablet Take 2 tablets (40 mg total) by mouth daily with breakfast.   [DISCONTINUED] promethazine -dextromethorphan (PROMETHAZINE -DM) 6.25-15 MG/5ML syrup Take 5 mLs by mouth 4 (four) times daily as needed.   [DISCONTINUED] Semaglutide -Weight Management 2.4 MG/0.75ML SOAJ Inject 2.4 mg into the skin once a week.   No facility-administered medications prior to visit.    ROS Negative unless specially indicated above in HPI.      Objective:     BP 114/74   Pulse 75   Temp 97.9 F (36.6 C) (Temporal)   Ht 5' 7.5 (1.715 m)   Wt 218 lb 12.8 oz (99.2 kg)   SpO2 100%   BMI 33.76 kg/m    Physical Exam Vitals and nursing note reviewed.  Constitutional:      General: She is not in acute distress.    Appearance: Normal appearance. She is not ill-appearing, toxic-appearing or diaphoretic.  HENT:     Head: Normocephalic.     Right Ear: Tympanic membrane, ear canal and external ear normal.     Left Ear: Tympanic membrane, ear  canal and external ear normal.     Nose: Nose normal.     Mouth/Throat:     Mouth: Mucous membranes are moist.     Pharynx: Oropharynx is clear.   Eyes:     Extraocular Movements: Extraocular movements intact.     Conjunctiva/sclera: Conjunctivae normal.     Pupils: Pupils are equal, round, and reactive to light.   Neck:     Thyroid : No thyroid  mass, thyromegaly or thyroid  tenderness.   Cardiovascular:     Rate and Rhythm: Normal rate and regular rhythm.     Pulses: Normal pulses.     Heart sounds: Normal heart sounds. No murmur heard.    No friction rub. No gallop.  Pulmonary:     Effort: Pulmonary effort is normal.     Breath sounds: Normal breath sounds.  Abdominal:      General: Bowel sounds are normal. There is no distension.     Palpations: Abdomen is soft. There is no mass.     Tenderness: There is no abdominal tenderness. There is no guarding.   Musculoskeletal:     Cervical back: Normal range of motion and neck supple. No tenderness.     Lumbar back: Tenderness (right lower paraspinal) present. No swelling, edema, deformity, signs of trauma, spasms or bony tenderness. Positive right straight leg raise test. Negative left straight leg raise test.     Right lower leg: No edema.     Left lower leg: No edema.   Skin:    General: Skin is warm and dry.     Capillary Refill: Capillary refill takes less than 2 seconds.     Findings: No lesion or rash.   Neurological:     General: No focal deficit present.     Mental Status: She is alert and oriented to person, place, and time.     Cranial Nerves: No cranial nerve deficit.     Motor: No weakness.     Gait: Gait normal.   Psychiatric:        Mood and Affect: Mood normal.        Behavior: Behavior normal.        Thought Content: Thought content normal.        Judgment: Judgment normal.      No results found for any visits on 08/28/23.     Assessment & Plan:    Kendra Payne was seen today for annual exam.  Diagnoses and all orders for this visit:  Routine general medical examination at a health care facility  Class 1 obesity due to excess calories without serious comorbidity with body mass index (BMI) of 33.0 to 33.9 in adult -     CBC with Differential/Platelet -     CMP14+EGFR -     TSH  Acute right-sided low back pain with right-sided sciatica Steroid IM injection today in office. Naproxen  BID as below. Do not take other NSAIDs with this. Continue zanaflex , biofreeze. Return to office for new or worsening symptoms, or if symptoms persist.  -     naproxen  (NAPROSYN ) 500 MG tablet; Take 1 tablet (500 mg total) by mouth 2 (two) times daily with a meal. -     methylPREDNISolone  acetate  (DEPO-MEDROL ) injection 80 mg  Need for hepatitis C screening test -     HCV Ab w Reflex to Quant PCR  Screening for endocrine, metabolic and immunity disorder -     CBC with Differential/Platelet -     CMP14+EGFR -  TSH  Encounter for screening for lipid disorder Fasting labs pending.  -     Lipid panel  Need for vaccination -     Tdap vaccine greater than or equal to 7yo IM     Immunization History  Administered Date(s) Administered   Influenza Inj Mdck Quad Pf 01/06/2019   Influenza-Unspecified 01/04/2018   Moderna Sars-Covid-2 Vaccination 04/02/2019, 05/03/2019   Td (Adult),5 Lf Tetanus Toxid, Preservative Free 08/09/1999   Tdap 08/09/1999, 04/24/2007, 12/25/2012, 08/28/2023    Health Maintenance  Topic Date Due   Hepatitis C Screening  Never done   Colonoscopy  10/12/2023 (Originally 09/29/2022)   HPV VACCINES (1 - 3-dose SCDM series) 08/27/2024 (Originally 09/28/2004)   COVID-19 Vaccine (3 - 2024-25 season) 09/12/2024 (Originally 11/06/2022)   MAMMOGRAM  09/27/2023   INFLUENZA VACCINE  10/06/2023   Cervical Cancer Screening (HPV/Pap Cotest)  08/19/2024   DTaP/Tdap/Td (6 - Td or Tdap) 08/27/2033   HIV Screening  Completed   Meningococcal B Vaccine  Aged Out    Discussed health benefits of physical activity, and encouraged her to engage in regular exercise appropriate for her age and condition.  Problem List Items Addressed This Visit   None Visit Diagnoses       Routine general medical examination at a health care facility    -  Primary     Class 1 obesity due to excess calories without serious comorbidity with body mass index (BMI) of 33.0 to 33.9 in adult       Relevant Orders   CBC with Differential/Platelet   CMP14+EGFR   TSH     Acute right-sided low back pain with right-sided sciatica       Relevant Medications   naproxen  (NAPROSYN ) 500 MG tablet   methylPREDNISolone  acetate (DEPO-MEDROL ) injection 80 mg (Start on 08/28/2023  8:45 AM)     Need for  hepatitis C screening test       Relevant Orders   HCV Ab w Reflex to Quant PCR     Screening for endocrine, metabolic and immunity disorder       Relevant Orders   CBC with Differential/Platelet   CMP14+EGFR   TSH     Encounter for screening for lipid disorder       Relevant Orders   Lipid panel     Need for vaccination       Relevant Orders   Tdap vaccine greater than or equal to 7yo IM (Completed)      Return in about 1 year (around 08/27/2024) for [name].   The patient indicates understanding of these issues and agrees with the plan.  Annabella CHRISTELLA Search, FNP

## 2023-08-29 ENCOUNTER — Ambulatory Visit: Payer: Self-pay | Admitting: Family Medicine

## 2023-08-29 LAB — CMP14+EGFR
ALT: 9 IU/L (ref 0–32)
AST: 13 IU/L (ref 0–40)
Albumin: 4.1 g/dL (ref 3.9–4.9)
Alkaline Phosphatase: 66 IU/L (ref 44–121)
BUN/Creatinine Ratio: 14 (ref 9–23)
BUN: 10 mg/dL (ref 6–24)
Bilirubin Total: 0.2 mg/dL (ref 0.0–1.2)
CO2: 20 mmol/L (ref 20–29)
Calcium: 9 mg/dL (ref 8.7–10.2)
Chloride: 104 mmol/L (ref 96–106)
Creatinine, Ser: 0.73 mg/dL (ref 0.57–1.00)
Globulin, Total: 2.8 g/dL (ref 1.5–4.5)
Glucose: 92 mg/dL (ref 70–99)
Potassium: 4.6 mmol/L (ref 3.5–5.2)
Sodium: 137 mmol/L (ref 134–144)
Total Protein: 6.9 g/dL (ref 6.0–8.5)
eGFR: 103 mL/min/{1.73_m2} (ref >59)

## 2023-08-29 LAB — CBC WITH DIFFERENTIAL/PLATELET
Basophils Absolute: 0 10*3/uL (ref 0.0–0.2)
Basos: 1 %
EOS (ABSOLUTE): 0.1 10*3/uL (ref 0.0–0.4)
Eos: 2 %
Hematocrit: 41.6 % (ref 34.0–46.6)
Hemoglobin: 13.4 g/dL (ref 11.1–15.9)
Immature Grans (Abs): 0 10*3/uL (ref 0.0–0.1)
Immature Granulocytes: 0 %
Lymphocytes Absolute: 2.2 10*3/uL (ref 0.7–3.1)
Lymphs: 36 %
MCH: 30.2 pg (ref 26.6–33.0)
MCHC: 32.2 g/dL (ref 31.5–35.7)
MCV: 94 fL (ref 79–97)
Monocytes Absolute: 0.5 10*3/uL (ref 0.1–0.9)
Monocytes: 8 %
Neutrophils Absolute: 3.4 10*3/uL (ref 1.4–7.0)
Neutrophils: 53 %
Platelets: 335 10*3/uL (ref 150–450)
RBC: 4.43 x10E6/uL (ref 3.77–5.28)
RDW: 12.3 % (ref 11.7–15.4)
WBC: 6.3 10*3/uL (ref 3.4–10.8)

## 2023-08-29 LAB — TSH: TSH: 1.19 u[IU]/mL (ref 0.450–4.500)

## 2023-08-29 LAB — HCV INTERPRETATION

## 2023-08-29 LAB — LIPID PANEL
Chol/HDL Ratio: 4.5 ratio — ABNORMAL HIGH (ref 0.0–4.4)
Cholesterol, Total: 178 mg/dL (ref 100–199)
HDL: 40 mg/dL (ref >39)
LDL Chol Calc (NIH): 120 mg/dL — ABNORMAL HIGH (ref 0–99)
Triglycerides: 98 mg/dL (ref 0–149)
VLDL Cholesterol Cal: 18 mg/dL (ref 5–40)

## 2023-08-29 LAB — HCV AB W REFLEX TO QUANT PCR

## 2023-09-23 ENCOUNTER — Other Ambulatory Visit (HOSPITAL_BASED_OUTPATIENT_CLINIC_OR_DEPARTMENT_OTHER): Payer: Self-pay

## 2023-09-25 ENCOUNTER — Other Ambulatory Visit (HOSPITAL_BASED_OUTPATIENT_CLINIC_OR_DEPARTMENT_OTHER): Payer: Self-pay

## 2023-09-25 MED ORDER — LO LOESTRIN FE 1 MG-10 MCG / 10 MCG PO TABS
1.0000 | ORAL_TABLET | Freq: Every day | ORAL | 0 refills | Status: DC
  Filled 2023-09-25: qty 84, 84d supply, fill #0

## 2023-11-20 ENCOUNTER — Other Ambulatory Visit (HOSPITAL_BASED_OUTPATIENT_CLINIC_OR_DEPARTMENT_OTHER): Payer: Self-pay

## 2023-11-20 DIAGNOSIS — Z01419 Encounter for gynecological examination (general) (routine) without abnormal findings: Secondary | ICD-10-CM | POA: Diagnosis not present

## 2023-11-20 DIAGNOSIS — Z1331 Encounter for screening for depression: Secondary | ICD-10-CM | POA: Diagnosis not present

## 2023-11-20 DIAGNOSIS — Z1231 Encounter for screening mammogram for malignant neoplasm of breast: Secondary | ICD-10-CM | POA: Diagnosis not present

## 2023-11-20 LAB — HM MAMMOGRAPHY

## 2023-11-20 MED ORDER — LO LOESTRIN FE 1 MG-10 MCG / 10 MCG PO TABS
1.0000 | ORAL_TABLET | Freq: Every day | ORAL | 3 refills | Status: AC
  Filled 2023-11-20 – 2023-12-01 (×2): qty 84, 84d supply, fill #0
  Filled 2024-03-06: qty 84, 84d supply, fill #1

## 2023-11-20 MED ORDER — PHENTERMINE HCL 37.5 MG PO TABS
37.5000 mg | ORAL_TABLET | Freq: Every day | ORAL | 2 refills | Status: DC
  Filled 2023-11-20: qty 30, 30d supply, fill #0
  Filled 2024-01-10: qty 30, 30d supply, fill #1
  Filled 2024-03-10: qty 30, 30d supply, fill #2

## 2023-12-01 ENCOUNTER — Other Ambulatory Visit (HOSPITAL_BASED_OUTPATIENT_CLINIC_OR_DEPARTMENT_OTHER): Payer: Self-pay

## 2024-01-10 ENCOUNTER — Other Ambulatory Visit (HOSPITAL_BASED_OUTPATIENT_CLINIC_OR_DEPARTMENT_OTHER): Payer: Self-pay

## 2024-02-09 ENCOUNTER — Ambulatory Visit

## 2024-02-19 ENCOUNTER — Other Ambulatory Visit: Payer: Self-pay

## 2024-02-19 ENCOUNTER — Other Ambulatory Visit (HOSPITAL_BASED_OUTPATIENT_CLINIC_OR_DEPARTMENT_OTHER): Payer: Self-pay

## 2024-02-19 ENCOUNTER — Ambulatory Visit: Admitting: Orthopedic Surgery

## 2024-02-19 ENCOUNTER — Encounter: Payer: Self-pay | Admitting: Orthopedic Surgery

## 2024-02-19 ENCOUNTER — Ambulatory Visit: Admitting: Family Medicine

## 2024-02-19 VITALS — BP 136/92 | HR 103 | Ht 67.0 in | Wt 220.0 lb

## 2024-02-19 DIAGNOSIS — M898X5 Other specified disorders of bone, thigh: Secondary | ICD-10-CM | POA: Diagnosis not present

## 2024-02-19 DIAGNOSIS — M5410 Radiculopathy, site unspecified: Secondary | ICD-10-CM | POA: Diagnosis not present

## 2024-02-19 MED ORDER — PREDNISONE 10 MG (48) PO TBPK
ORAL_TABLET | Freq: Every day | ORAL | 0 refills | Status: DC
  Filled 2024-02-19: qty 48, 12d supply, fill #0

## 2024-02-19 MED ORDER — GABAPENTIN 100 MG PO CAPS
100.0000 mg | ORAL_CAPSULE | Freq: Three times a day (TID) | ORAL | 2 refills | Status: DC
  Filled 2024-02-19: qty 90, 30d supply, fill #0
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
## 2024-02-19 NOTE — Progress Notes (Signed)
°  Intake history:  Chief Complaint  Patient presents with   Leg Pain    Left knee and left thigh painful with sitting with driving      Ht 5' 7 (8.297 m)   Wt 220 lb (99.8 kg)   BMI 34.46 kg/m  Body mass index is 34.46 kg/m.  Pharmacy? _____[facility]_________________________________  WHAT ARE WE SEEING YOU FOR TODAY?   Left leg  How long has this bothered you? (DOI?DOS?WS?)  6 weeks  Was there an injury? No  Anticoag.  No   Any ALLERGIES __________NKDA____________________________________   Treatment:  Have you taken:  Tylenol  No  Advil  Yes  Had PT No  Had injection No  Other  _________________________

## 2024-02-22 ENCOUNTER — Telehealth: Payer: Self-pay

## 2024-02-22 NOTE — Telephone Encounter (Signed)
 Patient came in and stated she was here Monday and saw Dr. Margrette.  He told her that it was sciatica but when she was going upstairs last night she had a sharp pain with a loud sound at the back of her kneecap, now she can't put any weight on her leg. She wants to know what Dr. Margrette thinks she should do. She is here in the lobby waiting for an answer.  Please advise

## 2024-02-23 ENCOUNTER — Ambulatory Visit: Admitting: Orthopedic Surgery

## 2024-02-23 ENCOUNTER — Encounter: Payer: Self-pay | Admitting: Orthopedic Surgery

## 2024-02-23 ENCOUNTER — Other Ambulatory Visit: Payer: Self-pay | Admitting: Orthopedic Surgery

## 2024-02-23 ENCOUNTER — Other Ambulatory Visit: Payer: Self-pay

## 2024-02-23 VITALS — BP 136/92 | Ht 67.0 in | Wt 220.0 lb

## 2024-02-23 DIAGNOSIS — M25562 Pain in left knee: Secondary | ICD-10-CM

## 2024-02-23 DIAGNOSIS — M660 Rupture of popliteal cyst: Secondary | ICD-10-CM | POA: Diagnosis not present

## 2024-02-23 NOTE — Patient Instructions (Signed)
 I suspect a Baker's cyst rupture  Symptomatic treatment  The patient can start using heat now as the 48-hour window has passed Compresses 20 minutes at a time back of the knee 3-4 times a day Start active range of motion to tolerance as tolerated 2-3 times a day Weightbearing as tolerated with crutches Brace support if brace tolerated and if it fits Recheck in 2 weeks

## 2024-02-23 NOTE — Progress Notes (Signed)
" °  Intake history:  Chief Complaint  Patient presents with   Knee Pain    Patient states left leg was feeling better after visit on Monday but on Tuesday something popped upper calf/ knee posterior and pain increased since back of leg knee she is using crutches now for ambulation      BP (!) 136/92 Comment: 02/19/24  Ht 5' 7 (1.702 m)   Wt 220 lb (99.8 kg)   BMI 34.46 kg/m  Body mass index is 34.46 kg/m.  Left leg   How long has this bothered you? (DOI?DOS?WS?)  Since Tuesday  Was there an injury? Yes  History 48 year old female previously seen to evaluate femoral nail which was put in 20 years ago.  We found no problems with the nail just some radicular pain thought to be secondary to sciatica or lumbar disc disease  The patient was improving until she went to [employer] walked around felt the knee getting tight.  When she stepped up leading with the left leg something popped and she felt immediate pain in the back of the knee and has not required crutches for weightbearing since  She is currently on a steroid taper  She is on crutches    Exam findings include:  No joint effusion Tenderness in the posterior aspect of the knee on the posterolateral side Flexion extension of the foot does not cause any pain in the calf  The popliteal fossa itself is nontender  Knee is otherwise stable  DG Knee 1-2 Views Left Result Date: 02/23/2024 Images of the left knee Acute pain posterior knee AP and lateral (patient could not perform sunrise view due to limited knee range of motion) Prior x-rays the patient has a femoral nail in place with 2 screws which are still intact the femoral fracture at the distal third of the femur healed nicely with no problems The knee joint itself looks relatively normal with no evidence of fracture.  She does have beaking of the tibial spines and the beginning of small osteophytes on the lateral margin of the joint nothing is noted posteriorly the  patellofemoral joint looks normal from the lateral view Impression normal left knee with prior hardware intact, no evidence of effusion     I suspect a Baker's cyst rupture  Symptomatic treatment  The patient can start using heat now as the 48-hour window has passed Compresses 20 minutes at a time back of the knee 3-4 times a day Start active range of motion to tolerance as tolerated 2-3 times a day Weightbearing as tolerated with crutches Brace support if brace tolerated and if it fits Recheck in 2 weeks        "

## 2024-02-23 NOTE — Progress Notes (Signed)
" °  Intake history:  Chief Complaint  Patient presents with   Knee Pain    Patient states left leg was feeling better after visit on Monday but on Tuesday something popped upper calf/ knee posterior and pain increased since back of leg knee she is using crutches now for ambulation      BP (!) 136/92 Comment: 02/19/24  Ht 5' 7 (1.702 m)   Wt 220 lb (99.8 kg)   BMI 34.46 kg/m  Body mass index is 34.46 kg/m.  Pharmacy? ______[facility]________________________________  WHAT ARE WE SEEING YOU FOR TODAY?   Left leg   How long has this bothered you? (DOI?DOS?WS?)  Since Tuesday  Was there an injury? Yes  Anticoag.  No   Any ALLERGIES __________Allergies[1] ____________________________________   Treatment:  Have you taken:  Tylenol  No  Advil  No  Had PT No  Had injection No  Other  ______________Gabapentin / Steriod taper ___________           [1] No Known Allergies  "

## 2024-03-06 ENCOUNTER — Other Ambulatory Visit (HOSPITAL_BASED_OUTPATIENT_CLINIC_OR_DEPARTMENT_OTHER): Payer: Self-pay

## 2024-03-08 ENCOUNTER — Ambulatory Visit: Admitting: Orthopedic Surgery

## 2024-03-08 ENCOUNTER — Encounter: Payer: Self-pay | Admitting: Orthopedic Surgery

## 2024-03-08 ENCOUNTER — Other Ambulatory Visit (HOSPITAL_BASED_OUTPATIENT_CLINIC_OR_DEPARTMENT_OTHER): Payer: Self-pay

## 2024-03-08 DIAGNOSIS — M25562 Pain in left knee: Secondary | ICD-10-CM

## 2024-03-08 DIAGNOSIS — F40240 Claustrophobia: Secondary | ICD-10-CM | POA: Diagnosis not present

## 2024-03-08 DIAGNOSIS — M23322 Other meniscus derangements, posterior horn of medial meniscus, left knee: Secondary | ICD-10-CM

## 2024-03-08 MED ORDER — ALPRAZOLAM 0.5 MG PO TABS
ORAL_TABLET | ORAL | 0 refills | Status: DC
  Filled 2024-03-08: qty 1, 1d supply, fill #0

## 2024-03-08 NOTE — Patient Instructions (Signed)
 While we are working on your approval for MRI please go ahead and call to schedule your appointment with [facility] within at least one (1) week.   Central Scheduling [phone]

## 2024-03-08 NOTE — Progress Notes (Signed)
" ° ° °  03/08/2024   Chief Complaint  Patient presents with   Knee Pain    Left     No diagnosis found.  What pharmacy do you use ? [facility]___________  DOI/DOS/ Date:  02/21/24 knee worse, leg pain ongoing for  while  Did you get better, worse or no change (Answer below)   Improved but still painful    Feels weak/ walking without crutches now    "

## 2024-03-08 NOTE — Progress Notes (Signed)
" ° °  Chief Complaint  Patient presents with   Knee Pain    Left    Leg Pain    left    Knee Pain   Leg Pain    49 year old female went up a step something popped in the back of her knee and she had posterior knee pain and could not weight-bear.  We treated her for Baker's cyst rupture with ice rest crutches bracing and she is here for 2-week follow-up.  The knee feels better but it feels weak   PHYSICAL EXAM:  Left knee exam she is very tender on the medial joint line and although she has regained most of her range of motion and has no posterior pain or tenderness she she has a positive McMurray's sign normal ACL and PCL drawer test  X-ray showed arthritis of the joint Assessment and Plan:   Rule out medial meniscus tear  Cane  Xanax for claustrophobia  Follow-up after MRI  Meds ordered this encounter  Medications   ALPRAZolam (XANAX) 0.5 MG tablet    Sig: Take 30 min before mri.    Dispense:  1 tablet    Refill:  0     "

## 2024-03-09 ENCOUNTER — Ambulatory Visit (HOSPITAL_COMMUNITY)
Admission: RE | Admit: 2024-03-09 | Discharge: 2024-03-09 | Disposition: A | Discharge status: Home / Self Care | Source: Ambulatory Visit | Attending: Orthopedic Surgery | Admitting: Orthopedic Surgery

## 2024-03-09 DIAGNOSIS — M25562 Pain in left knee: Secondary | ICD-10-CM | POA: Insufficient documentation

## 2024-03-11 ENCOUNTER — Other Ambulatory Visit (HOSPITAL_BASED_OUTPATIENT_CLINIC_OR_DEPARTMENT_OTHER): Payer: Self-pay

## 2024-03-21 ENCOUNTER — Ambulatory Visit: Admitting: Orthopedic Surgery

## 2024-03-21 ENCOUNTER — Telehealth: Payer: Self-pay | Admitting: Orthopedic Surgery

## 2024-03-21 ENCOUNTER — Encounter: Payer: Self-pay | Admitting: Orthopedic Surgery

## 2024-03-21 DIAGNOSIS — M1712 Unilateral primary osteoarthritis, left knee: Secondary | ICD-10-CM | POA: Diagnosis not present

## 2024-03-21 DIAGNOSIS — Z01818 Encounter for other preprocedural examination: Secondary | ICD-10-CM | POA: Diagnosis not present

## 2024-03-21 DIAGNOSIS — M23322 Other meniscus derangements, posterior horn of medial meniscus, left knee: Secondary | ICD-10-CM | POA: Diagnosis not present

## 2024-03-21 NOTE — Telephone Encounter (Signed)
 We chatted in my chart

## 2024-03-21 NOTE — Progress Notes (Signed)
" ° °  FOLLOW-UP OFFICE VISIT / MRI RESULTS   Patient: Kendra Payne           Date of Birth: 11-03-1975           MRN: [id number] Visit Date: 03/21/2024 Requested by: Joesph Annabella HERO, FNP [address] [place],  [place] [address] PCP: Joesph Annabella HERO, FNP    Encounter Diagnoses  Name Primary?   Derangement of posterior horn of medial meniscus of left knee Yes   Primary osteoarthritis of left knee     Chief Complaint  Patient presents with   Results    MRI review left knee    Knee Pain    left   Interpretation of imaging not separately reported by another qualified medical provider:  The medial meniscus is torn there is arthritis on the medial side.  The partial tear of the root is probably not repairable in this setting based on the patient's age weight and MRI appearance    RESULTS REPORT:   IMPRESSION: 1. Moderate medial compartment osteoarthrosis with suspected partial tear posterior root attachment medial meniscus and extrusion of the medial meniscal body with possible superimposed horizontal tearing.   2. Mild thickening of the medial collateral ligament which may reflect sequela of chronic sprain.   3.  High-grade chondromalacia patella.   4.  Moderate joint effusion.   5. Overall limited exam due to extensive hardware related artifact. If further evaluation is warranted, consider additional images with metal reduction sequences.   Electronically signed by: Margretta Blanch MD 03/16/2024 09:23 AM EST RP Workstation: [id number]  ASSESSMENT AND PLAN 49 year old female with symptomatic medial meniscus tear and arthritis on the medial side of the joint has decided to proceed with arthroscopy of the left knee with partial medial meniscectomy  Main complaints include inability to ambulate move and perform functional activities as she would like.  Pain is not too significant at this time.  The procedure has been fully reviewed with the patient; The risks and benefits  of surgery have been discussed and explained and understood. Alternative treatment has also been reviewed, questions were encouraged and answered. The postoperative plan is also been reviewed.  There are some risks associated with the surgery such as long-term arthritic acceleration which were discussed.  She is going to check with her employer and try to get back to us  on when she can proceed with the surgery  Decision for surgery arthroscopy left knee partial medial meniscectomy "

## 2024-03-21 NOTE — Addendum Note (Signed)
 Addended byBETHA JENEAN GREIG LELON on: 03/21/2024 04:44 PM   Modules accepted: Orders

## 2024-03-21 NOTE — Telephone Encounter (Signed)
 Dr. Areatha pt - pt lvm for Amy to call her regarding scheduling surgery.  [phone]

## 2024-03-21 NOTE — Progress Notes (Signed)
" ° ° °  03/21/2024   Chief Complaint  Patient presents with   Results    MRI review left knee    Knee Pain    left    No diagnosis found.  What pharmacy do you use ? [facility]____  DOI/DOS/ Date: 02/20/24  Did you get better, worse or no change (Answer below)   Unchanged      "

## 2024-03-21 NOTE — Patient Instructions (Addendum)
 Your surgery will be at [facility] by Dr // call me back to schedule on a Tuesday after you discuss with employer [phone] ask For Amy   The hospital will contact you with a preoperative appointment to discuss Anesthesia.  Please arrive on time or 15 minutes early for the preoperative appointment, they have a very tight schedule if you are late or do not come in your surgery will be cancelled.  The phone number is [phone]. Please bring your medications with you for the appointment. They will tell you the arrival time and medication instructions when you have your preoperative evaluation. Do not wear nail polish the day of your surgery and if you take Phentermine  you need to stop this medication ONE WEEK prior to your surgery. If you take Invokana, Farxiga, Jardiance, or Steglatro) - Hold 72 hours before the procedure.  If you take Ozempic ,  Mounjaro, Bydureon or Trulicity do not take for 8 days before your surgery. If you take Victoza, Rybelsis, Saxenda or Adlyxi stop 24 hours before the procedure.  Please arrive at the hospital 2 hours before procedure if scheduled at 9:30 or later in the day or at the time the nurse tells you at your preoperative visit.   If you have my chart do not use the time given in my chart use the time given to you by the nurse during your preoperative visit.   Your surgery  time may change. Please be available for phone calls the day of your surgery and the day before. The Short Stay department may need to discuss changes about your surgery time. Not reaching the you could lead to procedure delays and possible cancellation.  You must have a ride home and someone to stay with you for 24 to 48 hours. The person taking you home will receive and sign for the your discharge instructions.  Please be prepared to give your support persons name and telephone number to Central Registration. Dr Margrette will need that name and phone number post procedure.    SABRASEH

## 2024-03-22 ENCOUNTER — Other Ambulatory Visit (HOSPITAL_COMMUNITY): Payer: Self-pay

## 2024-04-04 NOTE — Patient Instructions (Addendum)
 "  Your procedure is scheduled on: 04/09/2024  Report to [facility] Main Entrance at   10;30  AM.  Call this number if you have problems the morning of surgery: [phone]   Remember:   Do not Eat after midnight, You may have CLEAR liquids until 8:30 am the morning of surgery  Water, soda, or BLACK coffee  NO CREAM or MILK         No Smoking the morning of surgery  :  Take these medicines the morning of surgery with A SIP OF WATER: none   Do not wear jewelry, make-up or nail polish.  Do not wear lotions, powders, or perfumes. You may wear deodorant.  Do not shave 48 hours prior to surgery. Men may shave face and neck.  Do not bring valuables to the hospital.  Contacts, dentures or bridgework may not be worn into surgery.  Leave suitcase in the car. After surgery it may be brought to your room.  For patients admitted to the hospital, checkout time is 11:00 AM the day of discharge.   Patients discharged the day of surgery will not be allowed to drive home.    Special Instructions: Shower using CHG night before surgery and shower the day of surgery use CHG.  Use special wash - you have one bottle of CHG for all showers.  You should use approximately 1/2 of the bottle for each shower. How to Use Chlorhexidine  at Home in the Shower Chlorhexidine  gluconate (CHG) is a germ-killing (antiseptic) wash that's used to clean the skin. It can get rid of the germs that normally live on the skin and can keep them away for about 24 hours. If you're having surgery, you may be told to shower with CHG at home the night before surgery. This can help lower your risk for infection. To use CHG wash in the shower, follow the steps below. Supplies needed: CHG body wash. Clean washcloth. Clean towel. How to use CHG in the shower Follow these steps unless you're told to use CHG in a different way: Start the shower. Use your normal soap and shampoo to wash your face and hair. Turn off the shower or move out of  the shower stream. Pour CHG onto a clean washcloth. Do not use any type of brush or rough sponge. Start at your neck, washing your body down to your toes. Make sure you: Wash the part of your body where the surgery will be done for at least 1 minute. Do not scrub. Do not use CHG on your head or face unless your health care provider tells you to. If it gets into your ears or eyes, rinse them well with water. Do not wash your genitals with CHG. Wash your back and under your arms. Make sure to wash skin folds. Let the CHG sit on your skin for 1-2 minutes or as long as told. Rinse your entire body in the shower, including all body creases and folds. Turn off the shower. Dry off with a clean towel. Do not put anything on your skin afterward, such as powder, lotion, or perfume. Put on clean clothes or pajamas. If it's the night before surgery, sleep in clean sheets. General tips Use CHG only as told, and follow the instructions on the label. Use the full amount of CHG as told. This is often one bottle. Do not smoke and stay away from flames after using CHG. Your skin may feel sticky after using CHG. This is normal. The  sticky feeling will go away as the CHG dries. Do not use CHG: If you have a chlorhexidine  allergy or have reacted to chlorhexidine  in the past. On open wounds or areas of skin that have broken skin, cuts, or scrapes. On babies younger than 97 months of age. Contact a health care provider if: You have questions about using CHG. Your skin gets irritated or itchy. You have a rash after using CHG. You swallow any CHG. Call your local poison control center [phone] in the [place] Your eyes itch badly, or they become very red or swollen. Your hearing changes. You have trouble seeing. If you can't reach your provider, go to an urgent care or emergency room. Do not drive yourself. Get help right away if: You have swelling or tingling in your mouth or throat. You make  high-pitched whistling sounds when you breathe, most often when you breathe out (wheeze). You have trouble breathing. These symptoms may be an emergency. Call 911 right away. Do not wait to see if the symptoms will go away. Do not drive yourself to the hospital. This information is not intended to replace advice given to you by your health care provider. Make sure you discuss any questions you have with your health care provider. Document Revised: 09/06/2022 Document Reviewed: 09/02/2021 Elsevier Patient Education  2024 [employer] Surgery to See or Treat a Knee Problem (Knee Arthroscopy): What to Know After After knee arthroscopy, it is common to have swelling and pain. You may also have a small amount of fluid coming from the cuts that were made on your knee. Follow these instructions at home: Medicines Take your medicines only as told. You may need to take steps to help treat or prevent trouble pooping (constipation), such as: Taking medicines to help you poop. Eating foods high in fiber, like beans, whole grains, and fresh fruits and vegetables. Drinking more fluids as told. Ask your health care provider if it's safe to drive or use machines while taking your medicine. If you have a brace: Wear it as told. Take it off only if your provider says you can. Check the skin around it every day. Tell your provider if you see problems. Loosen it if your toes tingle, are numb, or turn cold and blue. Keep the brace clean and dry. Bathing Do not take baths, swim, or use a hot tub until you're told it's OK. If your bandage isn't waterproof: Do not let it get wet. Cover it when you take a bath or shower. Use a cover that doesn't let any water in. Incision care  Take care of your cuts from surgery as told. Make sure you: Wash your hands with soap and water for at least 20 seconds before and after you change your bandage. If you can't use soap and water, use hand sanitizer. Change your  bandage. Leave stitches or skin glue alone. Leave tape strips alone unless you're told to take them off. You may trim the edges of the tape strips if they curl up. Check the area around your cuts every day for signs of infection. Check for: Redness. More swelling or pain More fluid or blood. Warmth. Pus or a bad smell. Managing pain, stiffness, and swelling  Use ice or an ice pack as told. If you have a brace that you can take off, remove it only as told. Put ice in a plastic bag or use the cold flow pad or cold therapy unit that you were given. Place a towel between  your skin and the bag or device. Leave the ice on for 20 minutes, 2-3 times a day. If your skin turns red, take off the ice right away to prevent skin damage. The risk of damage is higher if you can't feel pain, heat, or cold. Move your toes often to reduce stiffness and swelling. Raise your leg above the level of your heart while you are sitting or lying down. Use several pillows to keep your leg straight. Do not put a pillow just under the knee. If the knee is bent for a long time, this may lead to stiffness. Activity Rest as told. Get up to take short walks at least every 2 hours during the day. This helps you breathe better and keeps your blood flowing. Ask for help if you feel weak or unsteady. Do not stand or walk on your injured knee until you're told it's OK. Use crutches or a walker. Exercise as told. Avoid high-impact activities, such as running, jumping rope, and doing jumping jacks. Ask what things are safe for you to do at home. Ask when you can go back to work or school. General instructions Ask your provider when it is safe to drive. Do not smoke, vape, or use nicotine or tobacco. Wear compression stockings to reduce swelling and prevent blood clots in your legs. Keep all follow-up visits. Your provider will need to check how your knee is healing. Contact a health care provider if: You have any signs of  infection. You have very bad pain, and medicine does not help. Your cut from surgery breaks open. You have redness, swelling, pain, or warmth in your calf or at the back of your knee. You have numbness or tingling in your lower leg or your foot. Get help right away if: You have trouble breathing or shortness of breath. You have a fast and irregular heartbeat. You have chest pain. These symptoms may be an emergency. Call 911 right away. Do not wait to see if the symptoms will go away. Do not drive yourself to the hospital. This information is not intended to replace advice given to you by your health care provider. Make sure you discuss any questions you have with your health care provider. Document Revised: 01/20/2023 Document Reviewed: 08/22/2022 Elsevier Patient Education  2024 [employer]. General Anesthesia, Adult, Care After The following information offers guidance on how to care for yourself after your procedure. Your health care provider may also give you more specific instructions. If you have problems or questions, contact your health care provider. What can I expect after the procedure? After the procedure, it is common for people to: Have pain or discomfort at the IV site. Have nausea or vomiting. Have a sore throat or hoarseness. Have trouble concentrating. Feel cold or chills. Feel weak, sleepy, or tired (fatigue). Have soreness and body aches. These can affect parts of the body that were not involved in surgery. Follow these instructions at home: For the time period you were told by your health care provider:  Rest. Do not participate in activities where you could fall or become injured. Do not drive or use machinery. Do not drink alcohol. Do not take sleeping pills or medicines that cause drowsiness. Do not make important decisions or sign legal documents. Do not take care of children on your own. General instructions Drink enough fluid to keep your urine pale  yellow. If you have sleep apnea, surgery and certain medicines can increase your risk for breathing problems. Follow instructions  from your health care provider about wearing your sleep device: Anytime you are sleeping, including during daytime naps. While taking prescription pain medicines, sleeping medicines, or medicines that make you drowsy. Return to your normal activities as told by your health care provider. Ask your health care provider what activities are safe for you. Take over-the-counter and prescription medicines only as told by your health care provider. Do not use any products that contain nicotine or tobacco. These products include cigarettes, chewing tobacco, and vaping devices, such as e-cigarettes. These can delay incision healing after surgery. If you need help quitting, ask your health care provider. Contact a health care provider if: You have nausea or vomiting that does not get better with medicine. You vomit every time you eat or drink. You have pain that does not get better with medicine. You cannot urinate or have bloody urine. You develop a skin rash. You have a fever. Get help right away if: You have trouble breathing. You have chest pain. You vomit blood. These symptoms may be an emergency. Get help right away. Call 911. Do not wait to see if the symptoms will go away. Do not drive yourself to the hospital. Summary After the procedure, it is common to have a sore throat, hoarseness, nausea, vomiting, or to feel weak, sleepy, or fatigue. For the time period you were told by your health care provider, do not drive or use machinery. Get help right away if you have difficulty breathing, have chest pain, or vomit blood. These symptoms may be an emergency. This information is not intended to replace advice given to you by your health care provider. Make sure you discuss any questions you have with your health care provider. Document Revised: 05/21/2021 Document  Reviewed: 05/21/2021 Elsevier Patient Education  2024 [employer]. How to Use an Incentive Spirometer An incentive spirometer is a tool that measures how well you are filling your lungs with each breath. Learning to take long, deep breaths using this tool can help you keep your lungs clear and active. This may help to reverse or lessen your chance of developing breathing (pulmonary) problems, especially infection. You may be asked to use a spirometer: After a surgery. If you have a lung problem or a history of smoking. After a long period of time when you have been unable to move or be active. If the spirometer includes an indicator to show the highest number that you have reached, your health care provider or respiratory therapist will help you set a goal. Keep a log of your progress as told by your health care provider. What are the risks? Breathing too quickly may cause dizziness or cause you to pass out. Take your time so you do not get dizzy or light-headed. If you are in pain, you may need to take pain medicine before doing incentive spirometry. It is harder to take a deep breath if you are having pain. How to use your incentive spirometer  Sit up on the edge of your bed or on a chair. Hold the incentive spirometer so that it is in an upright position. Before you use the spirometer, breathe out normally. Place the mouthpiece in your mouth. Make sure your lips are closed tightly around it. Breathe in slowly and as deeply as you can through your mouth, causing the piston or the ball to rise toward the top of the chamber. Hold your breath for 3-5 seconds, or for as long as possible. If the spirometer includes a coach indicator,  use this to guide you in breathing. Slow down your breathing if the indicator goes above the marked areas. Remove the mouthpiece from your mouth and breathe out normally. The piston or ball will return to the bottom of the chamber. Rest for a few seconds, then repeat  the steps 10 or more times. Take your time and take a few normal breaths between deep breaths so that you do not get dizzy or light-headed. Do this every 1-2 hours when you are awake. If the spirometer includes a goal marker to show the highest number you have reached (best effort), use this as a goal to work toward during each repetition. After each set of 10 deep breaths, cough a few times. This will help to make sure that your lungs are clear. If you have an incision on your chest or abdomen from surgery, place a pillow or a rolled-up towel firmly against the incision when you cough. This can help to reduce pain while taking deep breaths and coughing. General tips When you are able to get out of bed: Walk around often. Continue to take deep breaths and cough in order to clear your lungs. Keep using the incentive spirometer until your health care provider says it is okay to stop using it. If you have been in the hospital, you may be told to keep using the spirometer at home. Contact a health care provider if: You are having difficulty using the spirometer. You have trouble using the spirometer as often as instructed. Your pain medicine is not giving enough relief for you to use the spirometer as told. You have a fever. Get help right away if: You develop shortness of breath. You develop a cough with bloody mucus from the lungs. You have fluid or blood coming from an incision site after you cough. Summary An incentive spirometer is a tool that can help you learn to take long, deep breaths to keep your lungs clear and active. You may be asked to use a spirometer after a surgery, if you have a lung problem or a history of smoking, or if you have been inactive for a long period of time. Use your incentive spirometer as instructed every 1-2 hours while you are awake. If you have an incision on your chest or abdomen, place a pillow or a rolled-up towel firmly against your incision when you cough.  This will help to reduce pain. Get help right away if you have shortness of breath, you cough up bloody mucus, or blood comes from your incision when you cough. This information is not intended to replace advice given to you by your health care provider. Make sure you discuss any questions you have with your health care provider. Document Revised: 12/30/2022 Document Reviewed: 12/30/2022 Elsevier Patient Education  2024 [employer]. "

## 2024-04-05 ENCOUNTER — Encounter (HOSPITAL_COMMUNITY): Payer: Self-pay

## 2024-04-05 ENCOUNTER — Other Ambulatory Visit: Payer: Self-pay

## 2024-04-05 ENCOUNTER — Encounter (HOSPITAL_COMMUNITY)
Admission: RE | Admit: 2024-04-05 | Discharge: 2024-04-05 | Disposition: A | Source: Ambulatory Visit | Attending: Orthopedic Surgery

## 2024-04-05 VITALS — BP 98/73 | HR 91 | Temp 97.8°F | Resp 16 | Ht 67.0 in | Wt 213.0 lb

## 2024-04-05 DIAGNOSIS — M23322 Other meniscus derangements, posterior horn of medial meniscus, left knee: Secondary | ICD-10-CM | POA: Diagnosis not present

## 2024-04-05 DIAGNOSIS — Z01818 Encounter for other preprocedural examination: Secondary | ICD-10-CM

## 2024-04-05 DIAGNOSIS — Z01812 Encounter for preprocedural laboratory examination: Secondary | ICD-10-CM | POA: Insufficient documentation

## 2024-04-05 LAB — CBC WITH DIFFERENTIAL/PLATELET
Abs Immature Granulocytes: 0.01 10*3/uL (ref 0.00–0.07)
Basophils Absolute: 0 10*3/uL (ref 0.0–0.1)
Basophils Relative: 1 %
Eosinophils Absolute: 0.1 10*3/uL (ref 0.0–0.5)
Eosinophils Relative: 2 %
HCT: 38.8 % (ref 36.0–46.0)
Hemoglobin: 12.6 g/dL (ref 12.0–15.0)
Immature Granulocytes: 0 %
Lymphocytes Relative: 32 %
Lymphs Abs: 2.1 10*3/uL (ref 0.7–4.0)
MCH: 29.9 pg (ref 26.0–34.0)
MCHC: 32.5 g/dL (ref 30.0–36.0)
MCV: 92.2 fL (ref 80.0–100.0)
Monocytes Absolute: 0.7 10*3/uL (ref 0.1–1.0)
Monocytes Relative: 10 %
Neutro Abs: 3.7 10*3/uL (ref 1.7–7.7)
Neutrophils Relative %: 55 %
Platelets: 292 10*3/uL (ref 150–400)
RBC: 4.21 MIL/uL (ref 3.87–5.11)
RDW: 13.5 % (ref 11.5–15.5)
WBC: 6.6 10*3/uL (ref 4.0–10.5)
nRBC: 0 % (ref 0.0–0.2)

## 2024-04-05 LAB — BASIC METABOLIC PANEL WITH GFR
Anion gap: 15 (ref 5–15)
BUN: 11 mg/dL (ref 6–20)
CO2: 17 mmol/L — ABNORMAL LOW (ref 22–32)
Calcium: 8.4 mg/dL — ABNORMAL LOW (ref 8.9–10.3)
Chloride: 108 mmol/L (ref 98–111)
Creatinine, Ser: 0.65 mg/dL (ref 0.44–1.00)
GFR, Estimated: >60 mL/min
Glucose, Bld: 96 mg/dL (ref 70–99)
Potassium: 3.5 mmol/L (ref 3.5–5.1)
Sodium: 139 mmol/L (ref 135–145)

## 2024-04-05 LAB — POCT PREGNANCY, URINE: Preg Test, Ur: NEGATIVE

## 2024-04-09 ENCOUNTER — Ambulatory Visit (HOSPITAL_COMMUNITY): Admitting: Certified Registered"

## 2024-04-09 ENCOUNTER — Ambulatory Visit (HOSPITAL_COMMUNITY)
Admission: RE | Admit: 2024-04-09 | Discharge: 2024-04-09 | Disposition: A | Discharge status: Home / Self Care | Attending: Orthopedic Surgery | Admitting: Orthopedic Surgery

## 2024-04-09 ENCOUNTER — Encounter (HOSPITAL_COMMUNITY)
Admission: RE | Disposition: A | Discharge status: Home / Self Care | Payer: Self-pay | Source: Home / Self Care | Attending: Orthopedic Surgery

## 2024-04-09 ENCOUNTER — Other Ambulatory Visit (HOSPITAL_BASED_OUTPATIENT_CLINIC_OR_DEPARTMENT_OTHER): Payer: Self-pay

## 2024-04-09 ENCOUNTER — Encounter (HOSPITAL_COMMUNITY): Payer: Self-pay | Admitting: Orthopedic Surgery

## 2024-04-09 DIAGNOSIS — M2242 Chondromalacia patellae, left knee: Secondary | ICD-10-CM | POA: Insufficient documentation

## 2024-04-09 DIAGNOSIS — M1712 Unilateral primary osteoarthritis, left knee: Secondary | ICD-10-CM | POA: Insufficient documentation

## 2024-04-09 DIAGNOSIS — M25462 Effusion, left knee: Secondary | ICD-10-CM | POA: Insufficient documentation

## 2024-04-09 DIAGNOSIS — X58XXXA Exposure to other specified factors, initial encounter: Secondary | ICD-10-CM | POA: Insufficient documentation

## 2024-04-09 DIAGNOSIS — S83242A Other tear of medial meniscus, current injury, left knee, initial encounter: Secondary | ICD-10-CM | POA: Insufficient documentation

## 2024-04-09 DIAGNOSIS — K219 Gastro-esophageal reflux disease without esophagitis: Secondary | ICD-10-CM | POA: Insufficient documentation

## 2024-04-09 DIAGNOSIS — Z01818 Encounter for other preprocedural examination: Secondary | ICD-10-CM

## 2024-04-09 MED ORDER — SODIUM CHLORIDE 0.9 % IR SOLN
Status: DC | PRN
  Administered 2024-04-09 (×2): 3000 mL

## 2024-04-09 MED ORDER — HYDROCODONE-ACETAMINOPHEN 5-325 MG PO TABS: 1.0000 | ORAL_TABLET | ORAL | Status: DC | PRN

## 2024-04-09 MED ORDER — KETOROLAC TROMETHAMINE 30 MG/ML IJ SOLN
INTRAMUSCULAR | Status: DC | PRN
  Administered 2024-04-09: 30 mg via INTRAVENOUS

## 2024-04-09 MED ORDER — CEFAZOLIN SODIUM-DEXTROSE 2-4 GM/100ML-% IV SOLN
INTRAVENOUS | Status: AC
  Filled 2024-04-09: qty 100

## 2024-04-09 MED ORDER — MIDAZOLAM HCL 2 MG/2ML IJ SOLN
INTRAMUSCULAR | Status: AC
  Filled 2024-04-09: qty 2

## 2024-04-09 MED ORDER — LIDOCAINE 2% (20 MG/ML) 5 ML SYRINGE
INTRAMUSCULAR | Status: AC
  Filled 2024-04-09: qty 5

## 2024-04-09 MED ORDER — OXYCODONE HCL 5 MG PO TABS: 5.0000 mg | ORAL_TABLET | Freq: Once | ORAL | Status: AC | PRN

## 2024-04-09 MED ORDER — ONDANSETRON HCL 4 MG/2ML IJ SOLN: 4.0000 mg | Freq: Once | INTRAMUSCULAR | Status: DC

## 2024-04-09 MED ORDER — FENTANYL CITRATE (PF) 100 MCG/2ML IJ SOLN
INTRAMUSCULAR | Status: AC
  Filled 2024-04-09: qty 2

## 2024-04-09 MED ORDER — KETOROLAC TROMETHAMINE 30 MG/ML IJ SOLN
INTRAMUSCULAR | Status: AC
  Filled 2024-04-09: qty 1

## 2024-04-09 MED ORDER — FENTANYL CITRATE (PF) 50 MCG/ML IJ SOSY
25.0000 ug | PREFILLED_SYRINGE | INTRAMUSCULAR | Status: DC | PRN
  Administered 2024-04-09 (×3): 50 ug via INTRAVENOUS
  Filled 2024-04-09 (×3): qty 1

## 2024-04-09 MED ORDER — IBUPROFEN 800 MG PO TABS
800.0000 mg | ORAL_TABLET | Freq: Three times a day (TID) | ORAL | 1 refills | Status: AC | PRN
  Filled 2024-04-09: qty 90, 30d supply, fill #0

## 2024-04-09 MED ORDER — BUPIVACAINE-EPINEPHRINE (PF) 0.5% -1:200000 IJ SOLN
INTRAMUSCULAR | Status: DC | PRN
  Administered 2024-04-09: 10 mL via PERINEURAL
  Administered 2024-04-09: 20 mL via PERINEURAL

## 2024-04-09 MED ORDER — LIDOCAINE HCL (CARDIAC) PF 100 MG/5ML IV SOSY
PREFILLED_SYRINGE | INTRAVENOUS | Status: DC | PRN
  Administered 2024-04-09: 100 mg via INTRAVENOUS

## 2024-04-09 MED ORDER — ORAL CARE MOUTH RINSE: 15.0000 mL | Freq: Once | OROMUCOSAL | Status: AC

## 2024-04-09 MED ORDER — PROPOFOL 10 MG/ML IV BOLUS
INTRAVENOUS | Status: DC | PRN
  Administered 2024-04-09: 200 mg via INTRAVENOUS

## 2024-04-09 MED ORDER — PHENYLEPHRINE 80 MCG/ML (10ML) SYRINGE FOR IV PUSH (FOR BLOOD PRESSURE SUPPORT)
PREFILLED_SYRINGE | INTRAVENOUS | Status: AC
  Filled 2024-04-09: qty 10

## 2024-04-09 MED ORDER — ONDANSETRON HCL 4 MG/2ML IJ SOLN
INTRAMUSCULAR | Status: DC | PRN
  Administered 2024-04-09: 4 mg via INTRAVENOUS

## 2024-04-09 MED ORDER — MIDAZOLAM HCL (PF) 2 MG/2ML IJ SOLN
INTRAMUSCULAR | Status: DC | PRN
  Administered 2024-04-09: 2 mg via INTRAVENOUS

## 2024-04-09 MED ORDER — ONDANSETRON HCL 4 MG/2ML IJ SOLN
INTRAMUSCULAR | Status: AC
  Filled 2024-04-09: qty 2

## 2024-04-09 MED ORDER — IBUPROFEN 800 MG PO TABS: 800.0000 mg | ORAL_TABLET | Freq: Once | ORAL | Status: DC

## 2024-04-09 MED ORDER — ASPIRIN 81 MG PO TBEC
81.0000 mg | DELAYED_RELEASE_TABLET | Freq: Two times a day (BID) | ORAL | 0 refills | Status: AC
  Filled 2024-04-09: qty 90, 45d supply, fill #0

## 2024-04-09 MED ORDER — PROPOFOL 10 MG/ML IV BOLUS
INTRAVENOUS | Status: AC
  Filled 2024-04-09: qty 20

## 2024-04-09 MED ORDER — PHENYLEPHRINE HCL (PRESSORS) 10 MG/ML IV SOLN
INTRAVENOUS | Status: DC | PRN
  Administered 2024-04-09: 50 ug via INTRAVENOUS
  Administered 2024-04-09 (×2): 100 ug via INTRAVENOUS

## 2024-04-09 MED ORDER — FENTANYL CITRATE (PF) 250 MCG/5ML IJ SOLN
INTRAMUSCULAR | Status: DC | PRN
  Administered 2024-04-09 (×2): 50 ug via INTRAVENOUS

## 2024-04-09 MED ORDER — CHLORHEXIDINE GLUCONATE 0.12 % MT SOLN
15.0000 mL | Freq: Once | OROMUCOSAL | Status: AC
  Administered 2024-04-09: 15 mL via OROMUCOSAL

## 2024-04-09 MED ORDER — DEXAMETHASONE SOD PHOSPHATE PF 10 MG/ML IJ SOLN
INTRAMUSCULAR | Status: DC | PRN
  Administered 2024-04-09: 6 mg via INTRAVENOUS

## 2024-04-09 MED ORDER — LACTATED RINGERS IV SOLN: INTRAVENOUS | Status: DC

## 2024-04-09 MED ORDER — HYDROCODONE-ACETAMINOPHEN 5-325 MG PO TABS
1.0000 | ORAL_TABLET | Freq: Four times a day (QID) | ORAL | 0 refills | Status: AC | PRN
  Filled 2024-04-09: qty 15, 4d supply, fill #0

## 2024-04-09 MED ORDER — BUPIVACAINE-EPINEPHRINE (PF) 0.5% -1:200000 IJ SOLN
INTRAMUSCULAR | Status: AC
  Filled 2024-04-09: qty 30

## 2024-04-09 MED ORDER — OXYCODONE HCL 5 MG/5ML PO SOLN
5.0000 mg | Freq: Once | ORAL | Status: AC | PRN
  Administered 2024-04-09: 5 mg via ORAL
  Filled 2024-04-09: qty 5

## 2024-04-09 MED ORDER — ONDANSETRON HCL 4 MG/2ML IJ SOLN: 4.0000 mg | Freq: Once | INTRAMUSCULAR | Status: DC | PRN

## 2024-04-09 MED ORDER — DEXMEDETOMIDINE HCL IN NACL 80 MCG/20ML IV SOLN
INTRAVENOUS | Status: DC | PRN
  Administered 2024-04-09: 12 ug via INTRAVENOUS

## 2024-04-09 MED ORDER — DEXAMETHASONE SOD PHOSPHATE PF 10 MG/ML IJ SOLN
INTRAMUSCULAR | Status: AC
  Filled 2024-04-09: qty 1

## 2024-04-09 MED ORDER — DEXMEDETOMIDINE HCL IN NACL 80 MCG/20ML IV SOLN
INTRAVENOUS | Status: AC
  Filled 2024-04-09: qty 20

## 2024-04-09 MED ORDER — CEFAZOLIN SODIUM-DEXTROSE 2-4 GM/100ML-% IV SOLN
2.0000 g | INTRAVENOUS | Status: AC
  Administered 2024-04-09: 2 g via INTRAVENOUS

## 2024-04-09 MED ORDER — ACETAMINOPHEN 500 MG PO TABS
500.0000 mg | ORAL_TABLET | Freq: Four times a day (QID) | ORAL | 0 refills | Status: AC | PRN
  Filled 2024-04-09: qty 30, 8d supply, fill #0

## 2024-04-09 NOTE — Brief Op Note (Signed)
 04/09/2024 11:56 AM  12:11 PM  PATIENT:  Kendra Payne  49 y.o. female  PRE-OPERATIVE DIAGNOSIS:  torn medial meniscus left knee  POST-OPERATIVE DIAGNOSIS:  torn medial meniscus left knee  PROCEDURE:  Procedures: ARTHROSCOPY, KNEE, WITH MEDIAL MENISCECTOMY (Left)  SURGEON:  Surgeons and Role:    * Margrette Taft BRAVO, MD - Primary  PHYSICIAN ASSISTANT:   ASSISTANTS: none   ANESTHESIA:   general  EBL:  0   BLOOD ADMINISTERED:none  DRAINS: none   LOCAL MEDICATIONS USED:  MARCAINE      SPECIMEN:  No Specimen  DISPOSITION OF SPECIMEN:  N/A  COUNTS:  YES  TOURNIQUET:  * Missing tourniquet times found for documented tourniquets in log: [id number] *  DICTATION: .Dragon Dictation  PLAN OF CARE: Discharge to home after PACU  PATIENT DISPOSITION:  PACU - hemodynamically stable.   Delay start of Pharmacological VTE agent (>24hrs) due to surgical blood loss or risk of bleeding: not applicable

## 2024-04-09 NOTE — Anesthesia Postprocedure Evaluation (Signed)
"   Anesthesia Post Note  Patient: TORRA PALA  Procedure(s) Performed: ARTHROSCOPY, KNEE, WITH MEDIAL MENISCECTOMY (Left: Knee)  Patient location during evaluation: Phase II Anesthesia Type: General Level of consciousness: awake Pain management: pain level controlled Vital Signs Assessment: post-procedure vital signs reviewed and stable Respiratory status: spontaneous breathing and respiratory function stable Cardiovascular status: blood pressure returned to baseline and stable Postop Assessment: no headache and no apparent nausea or vomiting Anesthetic complications: no Comments: Late entry   No notable events documented.   Last Vitals:  Vitals:   04/09/24 1301 04/09/24 1311  BP: 119/84 108/80  Pulse: 70 68  Resp: 14 15  Temp:  36.6 C  SpO2: 97% 98%    Last Pain:  Vitals:   04/09/24 1311  TempSrc: Axillary  PainSc: 5                  Yvonna PARAS Margerite Impastato      "

## 2024-04-09 NOTE — Anesthesia Procedure Notes (Signed)
 Procedure Name: LMA Insertion Date/Time: 04/09/2024 11:05 AM  Performed by: Pheobe Adine CROME, CRNAPre-anesthesia Checklist: Patient identified, Emergency Drugs available, Suction available, Patient being monitored and Timeout performed Patient Re-evaluated:Patient Re-evaluated prior to induction Oxygen Delivery Method: Circle system utilized Preoxygenation: Pre-oxygenation with 100% oxygen Induction Type: IV induction LMA: LMA inserted LMA Size: 3.0 Placement Confirmation: positive ETCO2, CO2 detector and breath sounds checked- equal and bilateral Tube secured with: Tape Dental Injury: Teeth and Oropharynx as per pre-operative assessment

## 2024-04-10 ENCOUNTER — Encounter (HOSPITAL_COMMUNITY): Payer: Self-pay | Admitting: Orthopedic Surgery

## 2024-04-10 DIAGNOSIS — Z9889 Other specified postprocedural states: Secondary | ICD-10-CM | POA: Insufficient documentation

## 2024-04-17 ENCOUNTER — Encounter: Admitting: Orthopedic Surgery

## 2024-04-17 DIAGNOSIS — Z9889 Other specified postprocedural states: Secondary | ICD-10-CM
